# Patient Record
Sex: Male | Born: 1965 | Race: Asian | Hispanic: No | Marital: Single | Smoking: Current every day smoker
Health system: Southern US, Community
[De-identification: ages and names within clinical notes are randomized; demographics above are authoritative.]

## PROBLEM LIST (undated history)

## (undated) DIAGNOSIS — I2510 Atherosclerotic heart disease of native coronary artery without angina pectoris: Secondary | ICD-10-CM

## (undated) DIAGNOSIS — R0600 Dyspnea, unspecified: Secondary | ICD-10-CM

## (undated) DIAGNOSIS — I10 Essential (primary) hypertension: Secondary | ICD-10-CM

## (undated) DIAGNOSIS — E782 Mixed hyperlipidemia: Secondary | ICD-10-CM

## (undated) DIAGNOSIS — I77819 Aortic ectasia, unspecified site: Secondary | ICD-10-CM

## (undated) DIAGNOSIS — E785 Hyperlipidemia, unspecified: Secondary | ICD-10-CM

## (undated) DIAGNOSIS — E119 Type 2 diabetes mellitus without complications: Secondary | ICD-10-CM

## (undated) DIAGNOSIS — R0609 Other forms of dyspnea: Secondary | ICD-10-CM

## (undated) DIAGNOSIS — Z720 Tobacco use: Secondary | ICD-10-CM

## (undated) DIAGNOSIS — R931 Abnormal findings on diagnostic imaging of heart and coronary circulation: Secondary | ICD-10-CM

## (undated) DIAGNOSIS — I517 Cardiomegaly: Secondary | ICD-10-CM

## (undated) HISTORY — DX: Type 2 diabetes mellitus without complications: E11.9

## (undated) HISTORY — DX: Hyperlipidemia, unspecified: E78.5

## (undated) HISTORY — DX: Dyspnea, unspecified: R06.00

## (undated) HISTORY — DX: Essential (primary) hypertension: I10

## (undated) HISTORY — DX: Abnormal findings on diagnostic imaging of heart and coronary circulation: R93.1

## (undated) HISTORY — DX: Other forms of dyspnea: R06.09

## (undated) HISTORY — DX: Aortic ectasia, unspecified site: I77.819

## (undated) HISTORY — DX: Tobacco use: Z72.0

## (undated) HISTORY — DX: Cardiomegaly: I51.7

## (undated) HISTORY — DX: Atherosclerotic heart disease of native coronary artery without angina pectoris: I25.10

---
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
## 2019-03-07 ENCOUNTER — Ambulatory Visit: Payer: Managed Care, Other (non HMO) | Attending: Adult Health Nurse Practitioner

## 2019-03-07 DIAGNOSIS — Z20822 Contact with and (suspected) exposure to covid-19: Secondary | ICD-10-CM

## 2019-03-08 LAB — NOVEL CORONAVIRUS, NAA: SARS-CoV-2, NAA: NOT DETECTED

## 2019-06-19 ENCOUNTER — Encounter: Payer: Self-pay | Admitting: Cardiology

## 2019-06-19 ENCOUNTER — Other Ambulatory Visit: Payer: Self-pay

## 2019-06-19 ENCOUNTER — Ambulatory Visit: Payer: Managed Care, Other (non HMO) | Admitting: Cardiology

## 2019-06-19 VITALS — BP 142/84 | HR 85 | Ht 68.5 in | Wt 268.2 lb

## 2019-06-19 DIAGNOSIS — R0602 Shortness of breath: Secondary | ICD-10-CM | POA: Diagnosis not present

## 2019-06-19 DIAGNOSIS — E785 Hyperlipidemia, unspecified: Secondary | ICD-10-CM | POA: Diagnosis not present

## 2019-06-19 DIAGNOSIS — R0609 Other forms of dyspnea: Secondary | ICD-10-CM

## 2019-06-19 DIAGNOSIS — R0600 Dyspnea, unspecified: Secondary | ICD-10-CM | POA: Diagnosis not present

## 2019-06-19 DIAGNOSIS — I10 Essential (primary) hypertension: Secondary | ICD-10-CM | POA: Diagnosis not present

## 2019-06-19 MED ORDER — CARVEDILOL 3.125 MG PO TABS
3.1250 mg | ORAL_TABLET | Freq: Two times a day (BID) | ORAL | 1 refills | Status: DC
Start: 2019-06-19 — End: 2019-07-17

## 2019-06-19 MED ORDER — METOPROLOL TARTRATE 50 MG PO TABS: 50.0000 mg | ORAL_TABLET | Freq: Once | ORAL | 0 refills | Status: DC

## 2019-06-19 NOTE — Progress Notes (Signed)
Cardiology Office Note:    Date:  06/19/2019   ID:  Kevin Munoz, DOB January 10, 1966, MRN [id number]  PCP:  London Pepper, MD  Cardiologist:  No primary care provider on file.  Electrophysiologist:  None   Referring MD: London Pepper, MD   Reason for visit: DOE  History of Present Illness:    Kevin Munoz is a 54 y.o. male, very pleasant gentleman originally from [place] with a hx of diabetes mellitus on oral medications (most recent hemoglobin A1c 6.4%), obesity, hypertension, hyperlipidemia, who is coming with new complaint of dyspnea on exertion.  The patient does not exercise on a regular basis but walks a lot at work and has been under a lot of stress lately and has had 2 episodes of acute dyspnea on exertion associated with chest tightness.  He denies any dizziness palpitation or syncope.  No lower extremity edema.  His father had myocardial infarction at age of 53.   Past Medical History:  Diagnosis Date  . Combined hyperlipidemia   . DM (diabetes mellitus) ([place])   . Hyperlipidemia   . Hypertension   . Tobacco abuse       Current Medications: Current Meds  Medication Sig  . amLODipine (NORVASC) 10 MG tablet Take 10 mg by mouth daily.  Marland Kitchen glipiZIDE (GLUCOTROL) 10 MG tablet Take 10 mg by mouth daily before breakfast.  . JARDIANCE 10 MG TABS tablet Take 10 mg by mouth daily.  Marland Kitchen losartan (COZAAR) 100 MG tablet Take 100 mg by mouth daily.  Marland Kitchen lovastatin (MEVACOR) 40 MG tablet Take 40 mg by mouth at bedtime.  . metFORMIN (GLUCOPHAGE) 1000 MG tablet Take 1,000 mg by mouth 2 (two) times daily with a meal.  . Omega-3 Fatty Acids (FISH OIL) 1000 MG CAPS Take by mouth.     Allergies:   Patient has no known allergies.   Social History   Socioeconomic History  . Marital status: Single    Spouse name: Not on file  . Number of children: Not on file  . Years of education: Not on file  . Highest education level: Not on file  Occupational History  . Not on file  Tobacco Use  .  Smoking status: Current Every Day Smoker  . Smokeless tobacco: Never Used  Substance and Sexual Activity  . Alcohol use: Not on file  . Drug use: Not on file  . Sexual activity: Not on file  Other Topics Concern  . Not on file  Social History Narrative  . Not on file   Social Determinants of Health   Financial Resource Strain:   . Difficulty of Paying Living Expenses:   Food Insecurity:   . Worried About Charity fundraiser in the Last Year:   . Arboriculturist in the Last Year:   Transportation Needs:   . Film/video editor (Medical):   Marland Kitchen Lack of Transportation (Non-Medical):   Physical Activity:   . Days of Exercise per Week:   . Minutes of Exercise per Session:   Stress:   . Feeling of Stress :   Social Connections:   . Frequency of Communication with Friends and Family:   . Frequency of Social Gatherings with Friends and Family:   . Attends Religious Services:   . Active Member of Clubs or Organizations:   . Attends Archivist Meetings:   Marland Kitchen Marital Status:     Family History: As per HPI ROS:   Please see the history of present illness.  All other systems reviewed and are negative.  EKGs/Labs/Other Studies Reviewed:    The following studies were reviewed today:  EKG:  EKG is ordered today.  The ekg ordered today demonstrates sinus rhythm, left axis deviation, poor R wave progression in the anterior leads, no prior EKG available for comparison.  Personally reviewed.  Recent Labs: No results found for requested labs within last 8760 hours.  Recent Lipid Panel No results found for: CHOL, TRIG, HDL, CHOLHDL, VLDL, LDLCALC, LDLDIRECT  Physical Exam:    VS:  BP (!) 142/84   Pulse 85   Ht 5' 8.5" (1.74 m)   Wt 268 lb 3.2 oz (121.7 kg)   SpO2 96%   BMI 40.18 kg/m     Wt Readings from Last 3 Encounters:  06/19/19 268 lb 3.2 oz (121.7 kg)     GEN: Obese, well developed in no acute distress HEENT: Normal NECK: No JVD; No carotid bruits  LYMPHATICS: No lymphadenopathy CARDIAC: RRR, no murmurs, rubs, gallops RESPIRATORY:  Clear to auscultation without rales, wheezing or rhonchi  ABDOMEN: Soft, non-tender, non-distended MUSCULOSKELETAL:  No edema; No deformity  SKIN: Warm and dry NEUROLOGIC:  Alert and oriented x 3 PSYCHIATRIC:  Normal affect    ASSESSMENT:    No diagnosis found. PLAN:    In order of problems listed above:  1. Dyspnea on exertion, -with multiple risk factors including diabetes, hypertension, hyperlipidemia, obesity and physical inactivity, will obtain coronary CTA to further evaluate.  We will also obtain an echocardiogram. 2. Hypertension -I will add carvedilol 3.125 mg 3. Hyperlipidemia -on fish oil and lovastatin, will continue.   Medication Adjustments/Labs and Tests Ordered: Current medicines are reviewed at length with the patient today.  Concerns regarding medicines are outlined above.  No orders of the defined types were placed in this encounter.  No orders of the defined types were placed in this encounter.   There are no Patient Instructions on file for this visit.   Signed, Tobias Alexander, MD  06/19/2019 2:19 PM    [facility]

## 2019-06-19 NOTE — Patient Instructions (Signed)
Medication Instructions:   START TAKING CARVEDILOL 3.125 MG BY MOUTH TWICE DAILY WITH MEALS  *If you need a refill on your cardiac medications before your next appointment, please call your pharmacy*  Testing/Procedures:  Your physician has requested that you have an echocardiogram. Echocardiography is a painless test that uses sound waves to create images of your heart. It provides your doctor with information about the size and shape of your heart and how well your heart's chambers and valves are working. This procedure takes approximately one hour. There are no restrictions for this procedure.  Your cardiac CT will be scheduled at one of the below locations:   [facility] [address] [place], [place] [address] [phone]  If scheduled at [facility], please arrive at the [facility] main entrance of [facility] 30 minutes prior to test start time. Proceed to the [facility] Radiology Department (first floor) to check-in and test prep.  Please follow these instructions carefully (unless otherwise directed):  On the Night Before the Test: . Be sure to Drink plenty of water. . Do not consume any caffeinated/decaffeinated beverages or chocolate 12 hours prior to your test. . Do not take any antihistamines 12 hours prior to your test. . If you take Metformin, GLIPIZIDE, AND JARDIANCE do not take 24 hours prior to test.  On the Day of the Test: . Drink plenty of water. Do not drink any water within one hour of the test. . Do not eat any food 4 hours prior to the test. . You may take your regular medications prior to the test.  . Take metoprolol 50 MG (Lopressor) two hours prior to test     After the Test: . Drink plenty of water. . After receiving IV contrast, you may experience a mild flushed feeling. This is normal. . On occasion, you may experience a mild rash up to 24 hours after the test. This is not dangerous. If this occurs, you can take  Benadryl 25 mg and increase your fluid intake. . If you experience trouble breathing, this can be serious. If it is severe call 911 IMMEDIATELY. If it is mild, please call our office. . If you take any of these medications: Glipizide/Metformin, JARDIANCE, Avandament, Glucavance, please do not take 48 hours after completing test unless otherwise instructed  Once we have confirmed authorization from your insurance company, we will call you to set up a date and time for your test.   For non-scheduling related questions, please contact the cardiac imaging nurse navigator should you have any questions/concerns: Rockwell Alexandria, RN Navigator Cardiac Imaging [facility] [phone] office  For scheduling needs, including cancellations and rescheduling, please call [phone].    Follow-Up:  3 MONTHS WITH DR. Delton See IN THE OFFICE

## 2019-07-08 ENCOUNTER — Other Ambulatory Visit: Payer: Self-pay

## 2019-07-08 ENCOUNTER — Other Ambulatory Visit: Payer: Managed Care, Other (non HMO) | Admitting: *Deleted

## 2019-07-08 DIAGNOSIS — R0602 Shortness of breath: Secondary | ICD-10-CM

## 2019-07-08 DIAGNOSIS — E785 Hyperlipidemia, unspecified: Secondary | ICD-10-CM

## 2019-07-08 DIAGNOSIS — R0609 Other forms of dyspnea: Secondary | ICD-10-CM

## 2019-07-08 DIAGNOSIS — I10 Essential (primary) hypertension: Secondary | ICD-10-CM

## 2019-07-09 LAB — BASIC METABOLIC PANEL
BUN/Creatinine Ratio: 21 — ABNORMAL HIGH (ref 9–20)
BUN: 16 mg/dL (ref 6–24)
CO2: 22 mmol/L (ref 20–29)
Calcium: 9.5 mg/dL (ref 8.7–10.2)
Chloride: 102 mmol/L (ref 96–106)
Creatinine, Ser: 0.77 mg/dL (ref 0.76–1.27)
GFR calc Af Amer: 120 mL/min/{1.73_m2} (ref >59)
GFR calc non Af Amer: 104 mL/min/{1.73_m2} (ref >59)
Glucose: 82 mg/dL (ref 65–99)
Potassium: 4.9 mmol/L (ref 3.5–5.2)
Sodium: 139 mmol/L (ref 134–144)

## 2019-07-10 ENCOUNTER — Telehealth (HOSPITAL_COMMUNITY): Payer: Self-pay | Admitting: *Deleted

## 2019-07-10 NOTE — Telephone Encounter (Signed)
Reaching out to patient (with a [place]) to offer assistance regarding upcoming cardiac imaging study; pt verbalizes understanding of appt date/time, parking situation and where to check in, pre-test NPO status and medications ordered, and verified current allergies; name and call back number provided for further questions should they arise  [facility] [phone] office [phone] cell

## 2019-07-11 ENCOUNTER — Ambulatory Visit (HOSPITAL_COMMUNITY)
Admission: RE | Admit: 2019-07-11 | Discharge: 2019-07-11 | Disposition: A | Discharge status: Home / Self Care | Payer: Managed Care, Other (non HMO) | Source: Ambulatory Visit | Attending: Cardiology | Admitting: Cardiology

## 2019-07-11 ENCOUNTER — Other Ambulatory Visit: Payer: Self-pay

## 2019-07-11 ENCOUNTER — Ambulatory Visit (HOSPITAL_BASED_OUTPATIENT_CLINIC_OR_DEPARTMENT_OTHER): Payer: Managed Care, Other (non HMO)

## 2019-07-11 DIAGNOSIS — I10 Essential (primary) hypertension: Secondary | ICD-10-CM | POA: Insufficient documentation

## 2019-07-11 DIAGNOSIS — R0600 Dyspnea, unspecified: Secondary | ICD-10-CM | POA: Diagnosis present

## 2019-07-11 DIAGNOSIS — R0602 Shortness of breath: Secondary | ICD-10-CM

## 2019-07-11 DIAGNOSIS — E785 Hyperlipidemia, unspecified: Secondary | ICD-10-CM

## 2019-07-11 DIAGNOSIS — R0609 Other forms of dyspnea: Secondary | ICD-10-CM

## 2019-07-11 DIAGNOSIS — I2510 Atherosclerotic heart disease of native coronary artery without angina pectoris: Secondary | ICD-10-CM

## 2019-07-11 MED ORDER — NITROGLYCERIN 0.4 MG SL SUBL: 0.8000 mg | SUBLINGUAL_TABLET | Freq: Once | SUBLINGUAL | Status: AC

## 2019-07-11 MED ORDER — NITROGLYCERIN 0.4 MG SL SUBL
SUBLINGUAL_TABLET | SUBLINGUAL | Status: AC
  Administered 2019-07-11: 0.8 mg via SUBLINGUAL
  Filled 2019-07-11: qty 2

## 2019-07-11 MED ORDER — IOHEXOL 350 MG/ML SOLN
80.0000 mL | Freq: Once | INTRAVENOUS | Status: AC | PRN
  Administered 2019-07-11: 80 mL via INTRAVENOUS

## 2019-07-12 DIAGNOSIS — I2510 Atherosclerotic heart disease of native coronary artery without angina pectoris: Secondary | ICD-10-CM | POA: Diagnosis not present

## 2019-07-17 ENCOUNTER — Encounter: Payer: Self-pay | Admitting: Physician Assistant

## 2019-07-17 ENCOUNTER — Telehealth: Payer: Self-pay | Admitting: *Deleted

## 2019-07-17 MED ORDER — CARVEDILOL 6.25 MG PO TABS
6.2500 mg | ORAL_TABLET | Freq: Two times a day (BID) | ORAL | 1 refills | Status: DC
Start: 2019-07-17 — End: 2019-10-25

## 2019-07-17 NOTE — Telephone Encounter (Signed)
The interpreter services called me back and confirmed that they will be present at the pts OV with Dayna Dunn PA-C for 6/3 at 1:30 pm.

## 2019-07-17 NOTE — Progress Notes (Signed)
Cardiology Office Note    Date:  07/18/2019   ID:  Kevin Munoz, DOB 12-18-1965, MRN [id number]  PCP:  Farris Has, MD  Cardiologist:  Tobias Alexander, MD  Electrophysiologist:  None   Chief Complaint: discuss cath -> abnormal coronary CT  History of Present Illness:   Kevin Munoz is a 54 y.o. male from [place] with history of diabetes mellitus on oral medications (most recent hemoglobin A1c 6.4%), obesity, hypertension, hyperlipidemia, dilation of aorta by echo who presents for discussion about abnormal coronary CTA.  He recently saw Dr. Delton See 06/19/19 for new evaluation of acute dyspnea on exertion associated with chest tightness. His father had myocardial infarction at age of 55. 2D echo 07/11/19 showed EF 65-70%, moderate LVH, mild dilation of aorta. Dr. Delton See increased his carvedilol to 6.25mg  BID. He underwent coronary CTA as outlined below showing stenosis of the proximal LAD, felt to be severe by FFR. No significant extracardiac findings seen. Dr. Delton See has recommended cardiac cath and her team set him up to come in for visit today to discuss in detail. He is seen with [facility] interpreter. He reports he is doing well and denies any acute changes from last visit. In general he has noticed some DOE by comparison to when he is younger. He has not had any recent current episodes of chest tightness (this occurred twice). No hx TIA/stroke.  Last labs reviewed 07/08/19 with K 4.9, Cr 0.77, LDL by [facility] 12/2018 was 94, otherwise no recent labs on file in Epic.   Past Medical History:  Diagnosis Date  . CAD in native artery    a. by cor CT 06/2019.  . Dilatation of aorta ([facility])    a. by echo 06/2019.  . DM (diabetes mellitus) ([facility])   . Hyperlipidemia   . Hypertension   . Left ventricular hypertrophy   . Tobacco abuse     History reviewed. No pertinent surgical history.  Current Medications: Current Meds  Medication Sig  . amLODipine (NORVASC) 10 MG tablet Take 10 mg by mouth  daily.  Marland Kitchen aspirin EC 81 MG tablet Take 1 tablet (81 mg total) by mouth daily.  . carvedilol (COREG) 6.25 MG tablet Take 1 tablet (6.25 mg total) by mouth 2 (two) times daily.  Marland Kitchen glipiZIDE (GLUCOTROL) 10 MG tablet Take 10 mg by mouth daily before breakfast.  . JARDIANCE 10 MG TABS tablet Take 10 mg by mouth daily.  Marland Kitchen losartan (COZAAR) 100 MG tablet Take 100 mg by mouth daily.  Marland Kitchen lovastatin (MEVACOR) 40 MG tablet Take 40 mg by mouth at bedtime.  . metFORMIN (GLUCOPHAGE) 1000 MG tablet Take 1,000 mg by mouth 2 (two) times daily with a meal.  . nitroGLYCERIN (NITROSTAT) 0.4 MG SL tablet Place 1 tablet (0.4 mg total) under the tongue every 5 (five) minutes as needed.  . Omega-3 Fatty Acids (FISH OIL) 1000 MG CAPS Take by mouth.  . [DISCONTINUED] metoprolol tartrate (LOPRESSOR) 50 MG tablet Take 1 tablet (50 mg total) by mouth once for 1 dose. Take 2 hours prior to your Coronary CT.      Allergies:   Patient has no known allergies.   Social History   Socioeconomic History  . Marital status: Single    Spouse name: Not on file  . Number of children: Not on file  . Years of education: Not on file  . Highest education level: Not on file  Occupational History  . Not on file  Tobacco Use  . Smoking status:  Current Every Day Smoker  . Smokeless tobacco: Never Used  Substance and Sexual Activity  . Alcohol use: Never  . Drug use: Never  . Sexual activity: Not on file  Other Topics Concern  . Not on file  Social History Narrative  . Not on file   Social Determinants of Health   Financial Resource Strain:   . Difficulty of Paying Living Expenses:   Food Insecurity:   . Worried About Programme researcher, broadcasting/film/video in the Last Year:   . Barista in the Last Year:   Transportation Needs:   . Freight forwarder (Medical):   Marland Kitchen Lack of Transportation (Non-Medical):   Physical Activity:   . Days of Exercise per Week:   . Minutes of Exercise per Session:   Stress:   . Feeling of Stress :    Social Connections:   . Frequency of Communication with Friends and Family:   . Frequency of Social Gatherings with Friends and Family:   . Attends Religious Services:   . Active Member of Clubs or Organizations:   . Attends Banker Meetings:   Marland Kitchen Marital Status:      Family History:  The patient's family history includes CAD in his father; Heart attack in his father.  ROS:   Please see the history of present illness.   All other systems are reviewed and otherwise negative.    EKGs/Labs/Other Studies Reviewed:    Studies reviewed are outlined and summarized above. Reports included below if pertinent.  Cardiac Cath 06/2019 EXAM: CT FFR ANALYSIS  CLINICAL DATA:  54 year old male with chest pain and abnormal CCTA.  FINDINGS: FFRct analysis was performed on the original cardiac CT angiogram dataset. Diagrammatic representation of the FFRct analysis is provided in a separate PDF document in PACS. This dictation was created using the PDF document and an interactive 3D model of the results. 3D model is not available in the EMR/PACS. Normal FFR range is >0.80.  1. Left Main: 0.95.  2. LAD: Proximal: 0.79, mid: 0.63. 3. LCX: 0.92. 4. RCA: Proximal: 0.98, distal: 0.88.  IMPRESSION: 1. CT FFR analysis showed severe stenosis in the proximal LAD. A cardiac catheterization is recommended.   Electronically Signed   By: Tobias Alexander   On: 07/12/2019 08:48  ADDENDUM REPORT: 07/11/2019 16:54  CLINICAL DATA:  54 year old male with hyperlipidemia, hypertension and chest pain.  EXAM: Cardiac/Coronary  CTA  TECHNIQUE: The patient was scanned on a [employer].  FINDINGS: A 100 kV prospective scan was triggered in the descending thoracic aorta at 111 HU's. Axial non-contrast 3 mm slices were carried out through the heart. The data set was analyzed on a dedicated work station and scored using the Agatson method. Gantry rotation speed was  250 msecs and collimation was .6 mm. 100 mg of PO Metoprolol and 0.8 mg of sl NTG was given. The 3D data set was reconstructed in 5% intervals of the 67-82 % of the R-R cycle. Diastolic phases were analyzed on a dedicated work station using MPR, MIP and VRT modes. The patient received 80 cc of contrast.  Aorta:  Normal size.  Mild diffuse calcifications.  No dissection.  Aortic Valve:  Trileaflet.  No calcifications.  Coronary Arteries:  Normal coronary origin.  Right dominance.  RCA is a large dominant artery that gives rise to PDA and PLA. There is only minimal plaque.  Left main is a large artery that gives rise to LAD and LCX arteries. Left  main has no plaque.  LAD is a large vessel that gives rise to one small diagonal artery. There is mild mixed plaque in the proximal LAD with stenosis 25-49%. There is another non-calcified plaque in the proximal LAD with stenosis 50-69% but possibly > 70%. Mid and distal LAD has minimal calcified plaque.  LCX is a non-dominant artery that gives rise to two OM branches. There is minimal plaque.  Other findings:  Normal pulmonary vein drainage into the left atrium.  Normal left atrial appendage without a thrombus.  Normal size of the pulmonary artery.  IMPRESSION: 1. Coronary calcium score of 5. This was 6 percentile for age and sex matched control.  2. Normal coronary origin with right dominance.  3. CAD-RADS 3. At least moderate stenosis in the proximal LAD. Consider symptom-guided anti-ischemic pharmacotherapy as well as risk factor modification per guideline directed care. Additional analysis with CT FFR will be submitted.   Electronically Signed   By: Ena Dawley   On: 07/11/2019 16:54  EXAM: OVER-READ INTERPRETATION  CT CHEST  The following report is an over-read performed by radiologist Dr. Vinnie Langton of [facility], [place] on 07/11/2019. This over-read does not include interpretation  of cardiac or coronary anatomy or pathology. The coronary calcium score/coronary CTA interpretation by the cardiologist is attached.  COMPARISON:  None.  FINDINGS: Within the visualized portions of the thorax there are no suspicious appearing pulmonary nodules or masses, there is no acute consolidative airspace disease, no pleural effusions, no pneumothorax and no lymphadenopathy. Visualized portions of the upper abdomen are unremarkable. There are no aggressive appearing lytic or blastic lesions noted in the visualized portions of the skeleton.  IMPRESSION: 1. No significant incidental noncardiac findings are noted.  Electronically Signed: By: Vinnie Langton M.D. On: 07/11/2019 15:16     EKG:  EKG is ordered today, personally reviewed, demonstrating NSR 69bpm, inferior Q waves in lead III, avF, TWI III, poor R wave progression in precordial leads  Recent Labs: 07/08/2019: BUN 16; Creatinine, Ser 0.77; Potassium 4.9; Sodium 139  Recent Lipid Panel No results found for: CHOL, TRIG, HDL, CHOLHDL, VLDL, LDLCALC, LDLDIRECT  PHYSICAL EXAM:    VS:  BP 114/78   Pulse 69   Ht 5' 8.5" (1.74 m)   Wt 269 lb 9.6 oz (122.3 kg)   SpO2 96%   BMI 40.40 kg/m   BMI: Body mass index is 40.4 kg/m.  GEN: Well nourished, well developed [place] M, in no acute distress HEENT: normocephalic, atraumatic Neck: no JVD, carotid bruits, or masses Cardiac: RRR; no murmurs, rubs, or gallops, no edema  Respiratory:  clear to auscultation bilaterally, normal work of breathing GI: soft, nontender, nondistended, + BS MS: no deformity or atrophy Skin: warm and dry, no rash Neuro:  Alert and Oriented x 3, Strength and sensation are intact, follows commands Psych: euthymic mood, full affect  Wt Readings from Last 3 Encounters:  07/18/19 269 lb 9.6 oz (122.3 kg)  06/19/19 268 lb 3.2 oz (121.7 kg)     ASSESSMENT & PLAN:   1. Dyspnea on exertion with CAD by CT - based on appearance of  coronary CTA, Dr. Meda Coffee has recommended cardiac cath. Risks and benefits of cardiac catheterization have been discussed with the patient.  These include bleeding, infection, kidney damage, stroke, heart attack, death.  The patient understands these risks and is willing to proceed. Denies any allergies. Start ASA 81mg  and rx SL NTG PRN. Continue beta blocker. Will update CBC and TSH with  labs today. Will also get CMET - if cardiac cath confirms definitive CAD, would suggest switching lovastatin before discharge to higher intensity statin with f/u labs in 6-8 weeks. He also inquired about cost of procedure. RMA called over to billing and they were able to furnish an out of pocket estimate of $910 for the procedural cost but this does not include the doctor's charges, therefore he was made aware this is likely not the full estimate. 2. Essential HTN - BP well controlled with recent titration of carvedilol. Continue present regimen. 3. Hyperlipidemia - see above regarding lipids. LDL was 94 by [facility] in 12/2018. He's not fasting today so we did not check in visit today, and regardless as above, may require statin titration anyway. Await cath. 4. Dilation of aorta - noted by echocardiogram but CT showed normal aortic size. Consider repeating echo in 1 year to trend.  Disposition: F/u with Dr. Beaulah Corin team in 2 weeks. He also inquired whether we could potentially text him information/instructions due to the language barrier. We do not specifically have HIPAA compliant texting ability, so provided information on signing up for MyChart.  Medication Adjustments/Labs and Tests Ordered: Current medicines are reviewed at length with the patient today.  Concerns regarding medicines are outlined above. Medication changes, Labs and Tests ordered today are summarized above and listed in the Patient Instructions accessible in Encounters.   Signed, Laurann Montana, PA-C  07/18/2019 2:26 PM    [facility]  [facility] [address] [place], [place], [place]  [address] Phone: [phone]; Fax: [phone]

## 2019-07-17 NOTE — Telephone Encounter (Signed)
Called the pt via [facility] and Japanese Translator Kevin Munoz was able to communicate to the pt about abnormal Coronary CT results and abnormal Echo results.   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  Pt wanted all communication to go through her about his results and plan per Dr. Nelson, instead of me endorsing his test results to him through the interpreter. Interpreter did remain on the line as needed.  Endorsed to Kevin (speaks English) that the pt will need to come into the office preferably tomorrow, to discuss his abnormal echo and Coronary CT results, and to arrange for LHC at this visit.  Informed Kevin that we will also need to obtain pre-procedure labs on him, as well as another EKG, per cath protocol.  Informed Kevin that we will arrange this cath at this visit, and go over in great detail, his cardiac cath instructions.  Both parties agreed that he does need a face-to-face encounter with the Provider, to go over his results and plan to have a cath.  Per Kevin, he will need to have a Japanese interpreter present, for unfortunately she will not be able to attend this appt, being its on such short notice.   Scheduled the pt to come in and see Kevin Dunn PA-C for tomorrow 6/3 at 1:45 pm.  Kevin is aware to endorse to him that he needs to arrive 15 mins prior to this appt, and bring his med list with him. Informed Kevin that I will have an interpreter present at this visit, to assist the pt.  Also endorsed to Kevin that she can tell him that Dr. Nelson also wants to increase his Carvedilol to 6.25 mg po bid, due to echo results (went over with them on the phone).  Informed Kevin that I will send in increased dose to his confirmed pharmacy of choice.  Kevin states she handles his medications as well as her Husband  does as well, so they will go and pick this up for the pt, and will explain to him the dose increase.  Informed Kevin that she needs to communicate to the pt that he needs to sign a DPR form in our office tomorrow, so that we can get her, and her Husbands contact information in writing, incase the pt is not around to give us permission to speak with her, and it  will therefore be in writing.  Kevin verbalized understanding and agrees with this plan.  She is communicating to the pt that he needs to ask for this form tomorrow and sign this accordingly, with the assistance of the Japanese Interpreter.   Called [facility], and informed them that we will need an experienced Japanese interpreter present at the pts visit tomorrow with Kevin Dunn PA-C at 1:45 pm, be here at 1:30 pm. Also informed the Interpreter line that the Interpreter who assist the pt tomorrow, will need to also help assist him with filling out his DPR form, to reflect that all medical information may be released to EC Kevin Munoz and her Spouse, incase pt is not around her to give us permission to talk to them like today.    OFF NOTE: Pt was seen on 06/19/19 by Dr. Nelson and had an EKG and BMET were done at that time.  This will more   than likely be over the 30 day window for scheduling cath, per protocol, so he will need to have an EKG, pre-cath labs done, and updated H&P done, at his visit with Kevin Spies PA-C tomorrow 6/3/21at 1:45 pm. Updated this in appt notes, as well as interpreter services contacted to be present at this visit.

## 2019-07-17 NOTE — Telephone Encounter (Signed)
Called the pt via [place] and [place] was able to communicate to the pt about abnormal Coronary CT results and abnormal Echo results.   Pt wanted all his affairs to be handled by [place] his EC and helps the pt out with MD appts, medicines, etc, for he has no family in this country and the pt works for her [employer].  Pt wanted all communication to go through her about his results and plan per Dr. Meda Coffee, instead of me endorsing his test results to him through the interpreter. Interpreter did remain on the line as needed.  Endorsed to Sharyn Lull (speaks English) that the pt will need to come into the office preferably tomorrow, to discuss his abnormal echo and Coronary CT results, and to arrange for LHC at this visit.  Informed Sharyn Lull that we will also need to obtain pre-procedure labs on him, as well as another EKG, per cath protocol.  Informed Sharyn Lull that we will arrange this cath at this visit, and go over in great detail, his cardiac cath instructions.  Both parties agreed that he does need a face-to-face encounter with the Provider, to go over his results and plan to have a cath.  Per Sharyn Lull, he will need to have a [place] interpreter present, for unfortunately she will not be able to attend this appt, being its on such short notice.   Scheduled the pt to come in and see Melina Copa PA-C for tomorrow 6/3 at 1:45 pm.  Sharyn Lull is aware to endorse to him that he needs to arrive 15 mins prior to this appt, and bring his med list with him. Informed Sharyn Lull that I will have an interpreter present at this visit, to assist the pt.  Also endorsed to Sharyn Lull that she can tell him that Dr. Meda Coffee also wants to increase his Carvedilol to 6.25 mg po bid, due to echo results (went over with them on the phone).  Informed Sharyn Lull that I will send in increased dose to his confirmed pharmacy of choice.  Sharyn Lull states she handles his medications as well as her Husband  does as well, so they will go and pick this up for the pt, and will explain to him the dose increase.  Informed Sharyn Lull that she needs to communicate to the pt that he needs to sign a DPR form in our office tomorrow, so that we can get her, and her Husbands contact information in writing, incase the pt is not around to give [place] permission to speak with her, and it  will therefore be in writing.  Sharyn Lull verbalized understanding and agrees with this plan.  She is communicating to the pt that he needs to ask for this form tomorrow and sign this accordingly, with the assistance of the [place].   Called [employer], and informed them that we will need an experienced [place] interpreter present at the pts visit tomorrow with Melina Copa PA-C at 1:45 pm, be here at 1:30 pm. Also informed the Interpreter line that the Interpreter who assist the pt tomorrow, will need to also help assist him with filling out his DPR form, to reflect that all medical information may be released to [place] and her Spouse, incase pt is not around her to give [place] permission to talk to them like today.    OFF NOTE: Pt was seen on 06/19/19 by Dr. Meda Coffee and had an EKG and BMET were done at that time.  This will more  than likely be over the 30 day window for scheduling cath, per protocol, so he will need to have an EKG, pre-cath labs done, and updated H&P done, at his visit with Ronie Spies PA-C tomorrow 6/3/21at 1:45 pm. Updated this in appt notes, as well as interpreter services contacted to be present at this visit.

## 2019-07-17 NOTE — Telephone Encounter (Signed)
-----   Message from Lars Masson, MD sent at 07/12/2019  8:54 AM EDT ----- He has severe stenosis in the proximal LAD and needs a cath, could you arrange? You will need an interpreter to talk to him. Thank you

## 2019-07-17 NOTE — H&P (View-Only) (Signed)
 Cardiology Office Note    Date:  07/18/2019   ID:  Kevin Munoz, DOB 04/02/1965, MRN [id number]  PCP:  Morrow, Aaron, MD  Cardiologist:  Katarina Nelson, MD  Electrophysiologist:  None   Chief Complaint: discuss cath -> abnormal coronary CT  History of Present Illness:   Kevin Munoz is a 54 y.o. male from [place] with history of diabetes mellitus on oral medications (most recent hemoglobin A1c 6.4%), obesity, hypertension, hyperlipidemia, dilation of aorta by echo who presents for discussion about abnormal coronary CTA.  He recently saw Dr. Nelson 06/19/19 for new evaluation of acute dyspnea on exertion associated with chest tightness. His father had myocardial infarction at age of 70. 2D echo 07/11/19 showed EF 65-70%, moderate LVH, mild dilation of aorta. Dr. Nelson increased his carvedilol to 6.25mg BID. He underwent coronary CTA as outlined below showing stenosis of the proximal LAD, felt to be severe by FFR. No significant extracardiac findings seen. Dr. Nelson has recommended cardiac cath and her team set him up to come in for visit today to discuss in detail. He is seen with Kevin Munoz interpreter. He reports he is doing well and denies any acute changes from last visit. In general he has noticed some DOE by comparison to when he is younger. He has not had any recent current episodes of chest tightness (this occurred twice). No hx TIA/stroke.  Last labs reviewed 07/08/19 with K 4.9, Cr 0.77, LDL by [facility] 12/2018 was 94, otherwise no recent labs on file in Epic.   Past Medical History:  Diagnosis Date  . CAD in native artery    a. by cor CT 06/2019.  . Dilatation of aorta (HCC)    a. by echo 06/2019.  . DM (diabetes mellitus) (HCC)   . Hyperlipidemia   . Hypertension   . Left ventricular hypertrophy   . Tobacco abuse     History reviewed. No pertinent surgical history.  Current Medications: Current Meds  Medication Sig  . amLODipine (NORVASC) 10 MG tablet Take 10 mg by mouth  daily.  . aspirin EC 81 MG tablet Take 1 tablet (81 mg total) by mouth daily.  . carvedilol (COREG) 6.25 MG tablet Take 1 tablet (6.25 mg total) by mouth 2 (two) times daily.  . glipiZIDE (GLUCOTROL) 10 MG tablet Take 10 mg by mouth daily before breakfast.  . JARDIANCE 10 MG TABS tablet Take 10 mg by mouth daily.  . losartan (COZAAR) 100 MG tablet Take 100 mg by mouth daily.  . lovastatin (MEVACOR) 40 MG tablet Take 40 mg by mouth at bedtime.  . metFORMIN (GLUCOPHAGE) 1000 MG tablet Take 1,000 mg by mouth 2 (two) times daily with a meal.  . nitroGLYCERIN (NITROSTAT) 0.4 MG SL tablet Place 1 tablet (0.4 mg total) under the tongue every 5 (five) minutes as needed.  . Omega-3 Fatty Acids (FISH OIL) 1000 MG CAPS Take by mouth.  . [DISCONTINUED] metoprolol tartrate (LOPRESSOR) 50 MG tablet Take 1 tablet (50 mg total) by mouth once for 1 dose. Take 2 hours prior to your Coronary CT.      Allergies:   Patient has no known allergies.   Social History   Socioeconomic History  . Marital status: Single    Spouse name: Not on file  . Number of children: Not on file  . Years of education: Not on file  . Highest education level: Not on file  Occupational History  . Not on file  Tobacco Use  . Smoking status:   Current Every Day Smoker  . Smokeless tobacco: Never Used  Substance and Sexual Activity  . Alcohol use: Never  . Drug use: Never  . Sexual activity: Not on file  Other Topics Concern  . Not on file  Social History Narrative  . Not on file   Social Determinants of Health   Financial Resource Strain:   . Difficulty of Paying Living Expenses:   Food Insecurity:   . Worried About Programme researcher, broadcasting/film/video in the Last Year:   . Barista in the Last Year:   Transportation Needs:   . Freight forwarder (Medical):   Marland Kitchen Lack of Transportation (Non-Medical):   Physical Activity:   . Days of Exercise per Week:   . Minutes of Exercise per Session:   Stress:   . Feeling of Stress :    Social Connections:   . Frequency of Communication with Friends and Family:   . Frequency of Social Gatherings with Friends and Family:   . Attends Religious Services:   . Active Member of Clubs or Organizations:   . Attends Banker Meetings:   Marland Kitchen Marital Status:      Family History:  The patient's family history includes CAD in his father; Heart attack in his father.  ROS:   Please see the history of present illness.   All other systems are reviewed and otherwise negative.    EKGs/Labs/Other Studies Reviewed:    Studies reviewed are outlined and summarized above. Reports included below if pertinent.  Cardiac Cath 06/2019 EXAM: CT FFR ANALYSIS  CLINICAL DATA:  54 year old male with chest pain and abnormal CCTA.  FINDINGS: FFRct analysis was performed on the original cardiac CT angiogram dataset. Diagrammatic representation of the FFRct analysis is provided in a separate PDF document in PACS. This dictation was created using the PDF document and an interactive 3D model of the results. 3D model is not available in the EMR/PACS. Normal FFR range is >0.80.  1. Left Main: 0.95.  2. LAD: Proximal: 0.79, mid: 0.63. 3. LCX: 0.92. 4. RCA: Proximal: 0.98, distal: 0.88.  IMPRESSION: 1. CT FFR analysis showed severe stenosis in the proximal LAD. A cardiac catheterization is recommended.   Electronically Signed   By: Kevin Munoz   On: 07/12/2019 08:48  ADDENDUM REPORT: 07/11/2019 16:54  CLINICAL DATA:  54 year old male with hyperlipidemia, hypertension and chest pain.  EXAM: Cardiac/Coronary  CTA  TECHNIQUE: The patient was scanned on a [employer].  FINDINGS: A 100 kV prospective scan was triggered in the descending thoracic aorta at 111 HU's. Axial non-contrast 3 mm slices were carried out through the heart. The data set was analyzed on a dedicated work station and scored using the Agatson method. Gantry rotation speed was  250 msecs and collimation was .6 mm. 100 mg of PO Metoprolol and 0.8 mg of sl NTG was given. The 3D data set was reconstructed in 5% intervals of the 67-82 % of the R-R cycle. Diastolic phases were analyzed on a dedicated work station using MPR, MIP and VRT modes. The patient received 80 cc of contrast.  Aorta:  Normal size.  Mild diffuse calcifications.  No dissection.  Aortic Valve:  Trileaflet.  No calcifications.  Coronary Arteries:  Normal coronary origin.  Right dominance.  RCA is a large dominant artery that gives rise to PDA and PLA. There is only minimal plaque.  Left main is a large artery that gives rise to LAD and LCX arteries. Left  main has no plaque.  LAD is a large vessel that gives rise to one small diagonal artery. There is mild mixed plaque in the proximal LAD with stenosis 25-49%. There is another non-calcified plaque in the proximal LAD with stenosis 50-69% but possibly > 70%. Mid and distal LAD has minimal calcified plaque.  LCX is a non-dominant artery that gives rise to two OM branches. There is minimal plaque.  Other findings:  Normal pulmonary vein drainage into the left atrium.  Normal left atrial appendage without a thrombus.  Normal size of the pulmonary artery.  IMPRESSION: 1. Coronary calcium score of 5. This was 6 percentile for age and sex matched control.  2. Normal coronary origin with right dominance.  3. CAD-RADS 3. At least moderate stenosis in the proximal LAD. Consider symptom-guided anti-ischemic pharmacotherapy as well as risk factor modification per guideline directed care. Additional analysis with CT FFR will be submitted.   Electronically Signed   By: Ena Dawley   On: 07/11/2019 16:54  EXAM: OVER-READ INTERPRETATION  CT CHEST  The following report is an over-read performed by radiologist Dr. Vinnie Langton of [facility], [place] on 07/11/2019. This over-read does not include interpretation  of cardiac or coronary anatomy or pathology. The coronary calcium score/coronary CTA interpretation by the cardiologist is attached.  COMPARISON:  None.  FINDINGS: Within the visualized portions of the thorax there are no suspicious appearing pulmonary nodules or masses, there is no acute consolidative airspace disease, no pleural effusions, no pneumothorax and no lymphadenopathy. Visualized portions of the upper abdomen are unremarkable. There are no aggressive appearing lytic or blastic lesions noted in the visualized portions of the skeleton.  IMPRESSION: 1. No significant incidental noncardiac findings are noted.  Electronically Signed: By: Vinnie Langton Munoz.D. On: 07/11/2019 15:16     EKG:  EKG is ordered today, personally reviewed, demonstrating NSR 69bpm, inferior Q waves in lead III, avF, TWI III, poor R wave progression in precordial leads  Recent Labs: 07/08/2019: BUN 16; Creatinine, Ser 0.77; Potassium 4.9; Sodium 139  Recent Lipid Panel No results found for: CHOL, TRIG, HDL, CHOLHDL, VLDL, LDLCALC, LDLDIRECT  PHYSICAL EXAM:    VS:  BP 114/78   Pulse 69   Ht 5' 8.5" (1.74 Munoz)   Wt 269 lb 9.6 oz (122.3 kg)   SpO2 96%   BMI 40.40 kg/Munoz   BMI: Body mass index is 40.4 kg/Munoz.  GEN: Well nourished, well developed Kevin Munoz, in no acute distress HEENT: normocephalic, atraumatic Neck: no JVD, carotid bruits, or masses Cardiac: RRR; no murmurs, rubs, or gallops, no edema  Respiratory:  clear to auscultation bilaterally, normal work of breathing GI: soft, nontender, nondistended, + BS MS: no deformity or atrophy Skin: warm and dry, no rash Neuro:  Alert and Oriented x 3, Strength and sensation are intact, follows commands Psych: euthymic mood, full affect  Wt Readings from Last 3 Encounters:  07/18/19 269 lb 9.6 oz (122.3 kg)  06/19/19 268 lb 3.2 oz (121.7 kg)     ASSESSMENT & PLAN:   1. Dyspnea on exertion with CAD by CT - based on appearance of  coronary CTA, Dr. Meda Coffee has recommended cardiac cath. Risks and benefits of cardiac catheterization have been discussed with the patient.  These include bleeding, infection, kidney damage, stroke, heart attack, death.  The patient understands these risks and is willing to proceed. Denies any allergies. Start ASA 81mg  and rx SL NTG PRN. Continue beta blocker. Will update CBC and TSH with  labs today. Will also get CMET - if cardiac cath confirms definitive CAD, would suggest switching lovastatin before discharge to higher intensity statin with f/u labs in 6-8 weeks. He also inquired about cost of procedure. RMA called over to billing and they were able to furnish an out of pocket estimate of $910 for the procedural cost but this does not include the doctor's charges, therefore he was made aware this is likely not the full estimate. 2. Essential HTN - BP well controlled with recent titration of carvedilol. Continue present regimen. 3. Hyperlipidemia - see above regarding lipids. LDL was 94 by [facility] in 12/2018. He's not fasting today so we did not check in visit today, and regardless as above, may require statin titration anyway. Await cath. 4. Dilation of aorta - noted by echocardiogram but CT showed normal aortic size. Consider repeating echo in 1 year to trend.  Disposition: F/u with Dr. Beaulah Corin team in 2 weeks. He also inquired whether we could potentially text him information/instructions due to the language barrier. We do not specifically have HIPAA compliant texting ability, so provided information on signing up for MyChart.  Medication Adjustments/Labs and Tests Ordered: Current medicines are reviewed at length with the patient today.  Concerns regarding medicines are outlined above. Medication changes, Labs and Tests ordered today are summarized above and listed in the Patient Instructions accessible in Encounters.   Signed, Laurann Montana, PA-C  07/18/2019 2:26 PM    [facility]  HeartCare [address] [place], [place], [place]  [address] Phone: [phone]; Fax: [phone]

## 2019-07-17 NOTE — Telephone Encounter (Signed)
-----   Message from Lars Masson, MD sent at 07/12/2019 10:36 AM EDT ----- Normal LVEF, moderate concentric hypertrophy, I would increase carvedilol to 6.25 mg PO BID  He also needs a left heart catheterization, please arrange.

## 2019-07-17 NOTE — Telephone Encounter (Signed)
The interpreter services called me back and confirmed that they will be present at the pts OV with Dayna Dunn PA-C for 6/3 at 1:30 pm.  

## 2019-07-18 ENCOUNTER — Encounter: Payer: Self-pay | Admitting: Physician Assistant

## 2019-07-18 ENCOUNTER — Other Ambulatory Visit: Payer: Self-pay

## 2019-07-18 ENCOUNTER — Ambulatory Visit: Payer: Managed Care, Other (non HMO) | Admitting: Physician Assistant

## 2019-07-18 VITALS — BP 114/78 | HR 69 | Ht 68.5 in | Wt 269.6 lb

## 2019-07-18 DIAGNOSIS — R0609 Other forms of dyspnea: Secondary | ICD-10-CM

## 2019-07-18 DIAGNOSIS — R0600 Dyspnea, unspecified: Secondary | ICD-10-CM

## 2019-07-18 DIAGNOSIS — I2510 Atherosclerotic heart disease of native coronary artery without angina pectoris: Secondary | ICD-10-CM

## 2019-07-18 DIAGNOSIS — E785 Hyperlipidemia, unspecified: Secondary | ICD-10-CM

## 2019-07-18 DIAGNOSIS — I10 Essential (primary) hypertension: Secondary | ICD-10-CM | POA: Diagnosis not present

## 2019-07-18 DIAGNOSIS — I77819 Aortic ectasia, unspecified site: Secondary | ICD-10-CM

## 2019-07-18 MED ORDER — ASPIRIN EC 81 MG PO TBEC
81.0000 mg | DELAYED_RELEASE_TABLET | Freq: Every day | ORAL | 3 refills | Status: DC
Start: 2019-07-18 — End: 2020-11-13

## 2019-07-18 MED ORDER — SODIUM CHLORIDE 0.9% FLUSH: 3.0000 mL | Freq: Two times a day (BID) | INTRAVENOUS | Status: AC

## 2019-07-18 MED ORDER — NITROGLYCERIN 0.4 MG SL SUBL: 0.4000 mg | SUBLINGUAL_TABLET | SUBLINGUAL | 3 refills | Status: AC | PRN

## 2019-07-18 NOTE — Patient Instructions (Addendum)
Medication Instructions:  Your physician has recommended you make the following change in your medication:  1.  START Aspirin 81 mg taking 1 daily 2.  START Nitroglycerin 0.4 s/l tablets.  USE ONLY AS NEEDED FOR CHEST PAIN.Marland Kitchen USE AS DIRECTED ON THE BOTTLE  *If you need a refill on your cardiac medications before your next appointment, please call your pharmacy*   Lab Work: TODAY:  CMET, CBC, & TSH  If you have labs (blood work) drawn today and your tests are completely normal, you will receive your results only by: Marland Kitchen MyChart Message (if you have MyChart) OR . A paper copy in the mail If you have any lab test that is abnormal or we need to change your treatment, we will call you to review the results.   Testing/Procedures: Your physician has requested that you have a cardiac catheterization. Cardiac catheterization is used to diagnose and/or treat various heart conditions. Doctors may recommend this procedure for a number of different reasons. The most common reason is to evaluate chest pain. Chest pain can be a symptom of coronary artery disease (CAD), and cardiac catheterization can show whether plaque is narrowing or blocking your heart's arteries. This procedure is also used to evaluate the valves, as well as measure the blood flow and oxygen levels in different parts of your heart. For further information please visit [email or web address] Please follow instruction sheet, BELOW:      [facility] ST OFFICE [address] [place], [address] [place] [address] Dept: [phone] Loc: [phone]  Cohl Behrens  07/18/2019  You are scheduled for a Cardiac Catheterization on Wednesday, June 9 with Dr. [name].  1. Please arrive at the [facility] (Main Entrance A) at [facility]: [address] [place], [place] [address] at 9:30 AM (This time is two hours before your procedure to ensure your  preparation). Free valet parking service is available.   Special note: Every effort is made to have your procedure done on time. Please understand that emergencies sometimes delay scheduled procedures.  2. Diet: Do not eat solid foods after midnight.  The patient may have clear liquids until 5am upon the day of the procedure.  3. Labs: WILL BE DONE TODAY, BUT ON Monday, June 7 AT 12:40, YOU WILL NEED TO GO TO [facility], [address], [place] FOR COVID TESTING.  ONCE YOU ARE TESTED, YOU WILL BE REQUIRED TO GO STRAIGHT HOME AND QUARANTINE UNTIL YOUR PROCEDURE.  4. Medication instructions in preparation for your procedure:   Contrast Allergy: No   Do not take Diabetes Med Glucophage (Metformin) on the day of the procedure and HOLD 48 HOURS AFTER THE PROCEDURE.  Do not take Jardiance or Glipizide the morning of your procedure  On the morning of your procedure, take your Aspirin and any morning medicines NOT listed above.  You may use sips of water.  5. Plan for one night stay--bring personal belongings. 6. Bring a current list of your medications and current insurance cards. 7. You MUST have a responsible person to drive you home. 8. Someone MUST be with you the first 24 hours after you arrive home or your discharge will be delayed. 9. Please wear clothes that are easy to get on and off and wear slip-on shoes.  Thank you for allowing [place] to care for you!   -- [facility]     Follow-Up: At [facility], you  and your health needs are our priority.  As part of our continuing mission to provide you with exceptional heart care, we have created designated Provider Care Teams.  These Care Teams include your primary Cardiologist (physician) and Advanced Practice Providers (APPs -  Physician Assistants and Nurse Practitioners) who all work together to provide you with the care you need, when you need it.  We recommend signing up for  the patient portal called "MyChart".  Sign up information is provided on this After Visit Summary.  MyChart is used to connect with patients for Virtual Visits (Telemedicine).  Patients are able to view lab/test results, encounter notes, upcoming appointments, etc.  Non-urgent messages can be sent to your provider as well.   To learn more about what you can do with MyChart, go to [email or web address].    Your next appointment:   2 week(s)   08/09/19 ARRIVE AT 10:30 FOR REGISTRATION   The format for your next appointment:   In Person  Provider:   You may see Ena Dawley, MD or one of the following Advanced Practice Providers on your designated Care Team:    Melina Copa, PA-C  Ermalinda Barrios, PA-C    Other Instructions

## 2019-07-19 ENCOUNTER — Telehealth (HOSPITAL_COMMUNITY): Payer: Self-pay | Admitting: *Deleted

## 2019-07-19 LAB — CBC
Hematocrit: 46.8 % (ref 37.5–51.0)
Hemoglobin: 15.5 g/dL (ref 13.0–17.7)
MCH: 31.5 pg (ref 26.6–33.0)
MCHC: 33.1 g/dL (ref 31.5–35.7)
MCV: 95 fL (ref 79–97)
Platelets: 268 10*3/uL (ref 150–450)
RBC: 4.92 x10E6/uL (ref 4.14–5.80)
RDW: 13.2 % (ref 11.6–15.4)
WBC: 6.6 10*3/uL (ref 3.4–10.8)

## 2019-07-19 LAB — COMPREHENSIVE METABOLIC PANEL
ALT: 24 IU/L (ref 0–44)
AST: 17 IU/L (ref 0–40)
Albumin/Globulin Ratio: 2.6 — ABNORMAL HIGH (ref 1.2–2.2)
Albumin: 4.9 g/dL (ref 3.8–4.9)
Alkaline Phosphatase: 45 IU/L — ABNORMAL LOW (ref 48–121)
BUN/Creatinine Ratio: 17 (ref 9–20)
BUN: 13 mg/dL (ref 6–24)
Bilirubin Total: 0.5 mg/dL (ref 0.0–1.2)
CO2: 25 mmol/L (ref 20–29)
Calcium: 9.8 mg/dL (ref 8.7–10.2)
Chloride: 100 mmol/L (ref 96–106)
Creatinine, Ser: 0.75 mg/dL — ABNORMAL LOW (ref 0.76–1.27)
GFR calc Af Amer: 121 mL/min/{1.73_m2} (ref >59)
GFR calc non Af Amer: 105 mL/min/{1.73_m2} (ref >59)
Globulin, Total: 1.9 g/dL (ref 1.5–4.5)
Glucose: 90 mg/dL (ref 65–99)
Potassium: 4.6 mmol/L (ref 3.5–5.2)
Sodium: 138 mmol/L (ref 134–144)
Total Protein: 6.8 g/dL (ref 6.0–8.5)

## 2019-07-19 LAB — TSH: TSH: 1.38 u[IU]/mL (ref 0.450–4.500)

## 2019-07-19 NOTE — Telephone Encounter (Signed)
error 

## 2019-07-22 ENCOUNTER — Other Ambulatory Visit (HOSPITAL_COMMUNITY): Payer: Managed Care, Other (non HMO)

## 2019-07-22 ENCOUNTER — Telehealth: Payer: Self-pay | Admitting: *Deleted

## 2019-07-22 ENCOUNTER — Ambulatory Visit: Payer: Managed Care, Other (non HMO) | Attending: Internal Medicine

## 2019-07-22 DIAGNOSIS — Z20822 Contact with and (suspected) exposure to covid-19: Secondary | ICD-10-CM

## 2019-07-22 NOTE — Telephone Encounter (Addendum)
Pt contacted pre-catheterization scheduled at [facility] for: Wednesday July 24, 2019 11:30 AM Verified arrival time and place: [facility] Main Entrance [facility]) at: 9:30 AM   No solid food after midnight prior to cath, clear liquids until 5 AM day of procedure.  Hold: Metformin-day of procedure and 48 hours post procedure. Glipizide-AM of procedure. Jardiance-AM of procedure.  Except hold medications AM meds can be  taken pre-cath with sip of water including: ASA 81 mg   Confirmed patient has responsible adult to drive home post procedure and observe 24 hours after arriving home:  yes  You are allowed ONE visitor in the waiting room during your procedure. Both you and your visitor must wear masks.      COVID-19 Pre-Screening Questions:  . In the past 7 to 10 days have you had a cough,  shortness of breath, headache, congestion, fever (100 or greater) body aches, chills, sore throat, or sudden loss of taste or sense of smell? no . Have you been around anyone with known Covid 19 in the past 7 to 10 days? no . Have you been around anyone who is awaiting Covid 19 test results in the past 7 to 10 days? no . Have you been around anyone who has mentioned symptoms of Covid 19 within the past 7 to 10 days? no     I was reviewing procedure instructions with patient, [employer] [place], [place] [id number], call disconnected, have been unable to reconnect through [employer].                Reconnected with [employer], reviewed procedure/mask/visitor instructions, COVID-19 screening questions with patient, [address] [place], [place] [address].

## 2019-07-23 LAB — SARS-COV-2, NAA 2 DAY TAT

## 2019-07-23 LAB — NOVEL CORONAVIRUS, NAA: SARS-CoV-2, NAA: NOT DETECTED

## 2019-07-24 ENCOUNTER — Encounter (HOSPITAL_COMMUNITY)
Admission: RE | Disposition: A | Discharge status: Home / Self Care | Payer: Self-pay | Source: Home / Self Care | Attending: Cardiovascular Disease

## 2019-07-24 ENCOUNTER — Ambulatory Visit (HOSPITAL_COMMUNITY)
Admission: RE | Admit: 2019-07-24 | Discharge: 2019-07-24 | Disposition: A | Discharge status: Home / Self Care | Payer: Managed Care, Other (non HMO) | Attending: Cardiovascular Disease | Admitting: Cardiovascular Disease

## 2019-07-24 ENCOUNTER — Other Ambulatory Visit: Payer: Self-pay

## 2019-07-24 DIAGNOSIS — E785 Hyperlipidemia, unspecified: Secondary | ICD-10-CM | POA: Insufficient documentation

## 2019-07-24 DIAGNOSIS — I2510 Atherosclerotic heart disease of native coronary artery without angina pectoris: Secondary | ICD-10-CM | POA: Diagnosis not present

## 2019-07-24 DIAGNOSIS — Z6841 Body Mass Index (BMI) 40.0 and over, adult: Secondary | ICD-10-CM | POA: Diagnosis not present

## 2019-07-24 DIAGNOSIS — R0609 Other forms of dyspnea: Secondary | ICD-10-CM

## 2019-07-24 DIAGNOSIS — Z79899 Other long term (current) drug therapy: Secondary | ICD-10-CM | POA: Insufficient documentation

## 2019-07-24 DIAGNOSIS — Z7984 Long term (current) use of oral hypoglycemic drugs: Secondary | ICD-10-CM | POA: Insufficient documentation

## 2019-07-24 DIAGNOSIS — E119 Type 2 diabetes mellitus without complications: Secondary | ICD-10-CM | POA: Diagnosis not present

## 2019-07-24 DIAGNOSIS — R931 Abnormal findings on diagnostic imaging of heart and coronary circulation: Secondary | ICD-10-CM

## 2019-07-24 DIAGNOSIS — F17200 Nicotine dependence, unspecified, uncomplicated: Secondary | ICD-10-CM | POA: Diagnosis not present

## 2019-07-24 DIAGNOSIS — E669 Obesity, unspecified: Secondary | ICD-10-CM | POA: Insufficient documentation

## 2019-07-24 DIAGNOSIS — R0600 Dyspnea, unspecified: Secondary | ICD-10-CM | POA: Insufficient documentation

## 2019-07-24 DIAGNOSIS — Z7982 Long term (current) use of aspirin: Secondary | ICD-10-CM | POA: Diagnosis not present

## 2019-07-24 DIAGNOSIS — I10 Essential (primary) hypertension: Secondary | ICD-10-CM | POA: Insufficient documentation

## 2019-07-24 HISTORY — PX: LEFT HEART CATH AND CORONARY ANGIOGRAPHY: CATH118249

## 2019-07-24 LAB — GLUCOSE, CAPILLARY: Glucose-Capillary: 151 mg/dL — ABNORMAL HIGH (ref 70–99)

## 2019-07-24 SURGERY — LEFT HEART CATH AND CORONARY ANGIOGRAPHY
Anesthesia: LOCAL

## 2019-07-24 MED ORDER — ONDANSETRON HCL 4 MG/2ML IJ SOLN: 4.0000 mg | Freq: Four times a day (QID) | INTRAMUSCULAR | Status: DC | PRN

## 2019-07-24 MED ORDER — VERAPAMIL HCL 2.5 MG/ML IV SOLN
INTRAVENOUS | Status: DC | PRN
  Administered 2019-07-24: 10 mL via INTRA_ARTERIAL

## 2019-07-24 MED ORDER — MIDAZOLAM HCL 2 MG/2ML IJ SOLN
INTRAMUSCULAR | Status: AC
  Filled 2019-07-24: qty 2

## 2019-07-24 MED ORDER — LABETALOL HCL 5 MG/ML IV SOLN: 10.0000 mg | INTRAVENOUS | Status: DC | PRN

## 2019-07-24 MED ORDER — SODIUM CHLORIDE 0.9 % IV SOLN: 250.0000 mL | INTRAVENOUS | Status: DC | PRN

## 2019-07-24 MED ORDER — SODIUM CHLORIDE 0.9% FLUSH: 3.0000 mL | INTRAVENOUS | Status: DC | PRN

## 2019-07-24 MED ORDER — HEPARIN (PORCINE) IN NACL 1000-0.9 UT/500ML-% IV SOLN
INTRAVENOUS | Status: AC
  Filled 2019-07-24: qty 1000

## 2019-07-24 MED ORDER — FENTANYL CITRATE (PF) 100 MCG/2ML IJ SOLN
INTRAMUSCULAR | Status: AC
  Filled 2019-07-24: qty 2

## 2019-07-24 MED ORDER — SODIUM CHLORIDE 0.9 % WEIGHT BASED INFUSION: 1.0000 mL/kg/h | INTRAVENOUS | Status: DC

## 2019-07-24 MED ORDER — FENTANYL CITRATE (PF) 100 MCG/2ML IJ SOLN
INTRAMUSCULAR | Status: DC | PRN
  Administered 2019-07-24: 50 ug via INTRAVENOUS

## 2019-07-24 MED ORDER — SODIUM CHLORIDE 0.9 % WEIGHT BASED INFUSION
3.0000 mL/kg/h | INTRAVENOUS | Status: DC
  Administered 2019-07-24: 3 mL/kg/h via INTRAVENOUS

## 2019-07-24 MED ORDER — HEPARIN SODIUM (PORCINE) 1000 UNIT/ML IJ SOLN
INTRAMUSCULAR | Status: DC | PRN
  Administered 2019-07-24: 6000 [IU] via INTRAVENOUS

## 2019-07-24 MED ORDER — ASPIRIN 81 MG PO CHEW: 81.0000 mg | CHEWABLE_TABLET | ORAL | Status: DC

## 2019-07-24 MED ORDER — IOHEXOL 350 MG/ML SOLN
INTRAVENOUS | Status: DC | PRN
  Administered 2019-07-24: 75 mL

## 2019-07-24 MED ORDER — MIDAZOLAM HCL 2 MG/2ML IJ SOLN
INTRAMUSCULAR | Status: DC | PRN
  Administered 2019-07-24: 2 mg via INTRAVENOUS

## 2019-07-24 MED ORDER — HEPARIN (PORCINE) IN NACL 1000-0.9 UT/500ML-% IV SOLN
INTRAVENOUS | Status: DC | PRN
  Administered 2019-07-24 (×2): 500 mL

## 2019-07-24 MED ORDER — ACETAMINOPHEN 325 MG PO TABS: 650.0000 mg | ORAL_TABLET | ORAL | Status: DC | PRN

## 2019-07-24 MED ORDER — ASPIRIN 81 MG PO CHEW: 81.0000 mg | CHEWABLE_TABLET | Freq: Every day | ORAL | Status: DC

## 2019-07-24 MED ORDER — HYDRALAZINE HCL 20 MG/ML IJ SOLN: 10.0000 mg | INTRAMUSCULAR | Status: DC | PRN

## 2019-07-24 MED ORDER — LIDOCAINE HCL (PF) 1 % IJ SOLN
INTRAMUSCULAR | Status: DC | PRN
  Administered 2019-07-24: 2 mL

## 2019-07-24 MED ORDER — SODIUM CHLORIDE 0.9 % IV SOLN: INTRAVENOUS | Status: DC

## 2019-07-24 MED ORDER — SODIUM CHLORIDE 0.9% FLUSH: 3.0000 mL | Freq: Two times a day (BID) | INTRAVENOUS | Status: DC

## 2019-07-24 MED ORDER — DIAZEPAM 5 MG PO TABS: 5.0000 mg | ORAL_TABLET | Freq: Four times a day (QID) | ORAL | Status: DC | PRN

## 2019-07-24 MED ORDER — VERAPAMIL HCL 2.5 MG/ML IV SOLN
INTRAVENOUS | Status: AC
  Filled 2019-07-24: qty 2

## 2019-07-24 MED ORDER — LIDOCAINE HCL (PF) 1 % IJ SOLN
INTRAMUSCULAR | Status: AC
  Filled 2019-07-24: qty 30

## 2019-07-24 SURGICAL SUPPLY — 11 items
CATH INFINITI 5 FR JL3.5 (CATHETERS) ×2 IMPLANT
CATH INFINITI 5FR JL4 (CATHETERS) ×2 IMPLANT
CATH OPTITORQUE TIG 4.0 5F (CATHETERS) ×2 IMPLANT
DEVICE RAD COMP TR BAND LRG (VASCULAR PRODUCTS) ×2 IMPLANT
GLIDESHEATH SLEND SS 6F .021 (SHEATH) ×2 IMPLANT
GUIDEWIRE INQWIRE 1.5J.035X260 (WIRE) ×1 IMPLANT
INQWIRE 1.5J .035X260CM (WIRE) ×2
KIT HEART LEFT (KITS) ×2 IMPLANT
PACK CARDIAC CATHETERIZATION (CUSTOM PROCEDURE TRAY) ×2 IMPLANT
TRANSDUCER W/STOPCOCK (MISCELLANEOUS) ×2 IMPLANT
TUBING CIL FLEX 10 FLL-RA (TUBING) ×2 IMPLANT

## 2019-07-24 NOTE — Progress Notes (Signed)
Interpreter used to review discharge instructions with patient. Patient and friend verbalize understanding of discharge instructions.

## 2019-07-24 NOTE — Discharge Instructions (Signed)
Radial Site Care  This sheet gives you information about how to care for yourself after your procedure. Your health care provider may also give you more specific instructions. If you have problems or questions, contact your health care provider. What can I expect after the procedure? After the procedure, it is common to have:  Bruising and tenderness at the catheter insertion area. Follow these instructions at home: Medicines  Take over-the-counter and prescription medicines only as told by your health care provider. Insertion site care  Follow instructions from your health care provider about how to take care of your insertion site. Make sure you: ? Wash your hands with soap and water before you change your bandage (dressing). If soap and water are not available, use hand sanitizer. ? Change your dressing as told by your health care provider. ? Leave stitches (sutures), skin glue, or adhesive strips in place. These skin closures may need to stay in place for 2 weeks or longer. If adhesive strip edges start to loosen and curl up, you may trim the loose edges. Do not remove adhesive strips completely unless your health care provider tells you to do that.  Check your insertion site every day for signs of infection. Check for: ? Redness, swelling, or pain. ? Fluid or blood. ? Pus or a bad smell. ? Warmth.  Do not take baths, swim, or use a hot tub until your health care provider approves.  You may shower 24-48 hours after the procedure, or as directed by your health care provider. ? Remove the dressing and gently wash the site with plain soap and water. ? Pat the area dry with a clean towel. ? Do not rub the site. That could cause bleeding.  Do not apply powder or lotion to the site. Activity   For 24 hours after the procedure, or as directed by your health care provider: ? Do not flex or bend the affected arm. ? Do not push or pull heavy objects with the affected arm. ? Do not  drive yourself home from the hospital or clinic. You may drive 24 hours after the procedure unless your health care provider tells you not to. ? Do not operate machinery or power tools.  Do not lift anything that is heavier than 10 lb (4.5 kg), or the limit that you are told, until your health care provider says that it is safe.  Ask your health care provider when it is okay to: ? Return to work or school. ? Resume usual physical activities or sports. ? Resume sexual activity. General instructions  If the catheter site starts to bleed, raise your arm and put firm pressure on the site. If the bleeding does not stop, get help right away. This is a medical emergency.  If you went home on the same day as your procedure, a responsible adult should be with you for the first 24 hours after you arrive home.  Keep all follow-up visits as told by your health care provider. This is important. Contact a health care provider if:  You have a fever.  You have redness, swelling, or yellow drainage around your insertion site. Get help right away if:  You have unusual pain at the radial site.  The catheter insertion area swells very fast.  The insertion area is bleeding, and the bleeding does not stop when you hold steady pressure on the area.  Your arm or hand becomes pale, cool, tingly, or numb. These symptoms may represent a serious problem   that is an emergency. Do not wait to see if the symptoms will go away. Get medical help right away. Call your local emergency services (911 in the [place] Do not drive yourself to the hospital. Summary  After the procedure, it is common to have bruising and tenderness at the site.  Follow instructions from your health care provider about how to take care of your radial site wound. Check the wound every day for signs of infection.  Do not lift anything that is heavier than 10 lb (4.5 kg), or the limit that you are told, until your health care provider says  that it is safe. This information is not intended to replace advice given to you by your health care provider. Make sure you discuss any questions you have with your health care provider. Document Revised: 03/08/2017 Document Reviewed: 03/08/2017 Elsevier Patient Education  2020 [employer]  

## 2019-07-24 NOTE — Interval H&P Note (Signed)
Cath Lab Visit (complete for each Cath Lab visit)  Clinical Evaluation Leading to the Procedure:   ACS: No.  Non-ACS:    Anginal Classification: CCS II  Anti-ischemic medical therapy: Maximal Therapy (2 or more classes of medications)  Non-Invasive Test Results: Intermediate-risk stress test findings: cardiac mortality 1-3%/year  Prior CABG: No previous CABG      History and Physical Interval Note:  07/24/2019 1:10 PM  Kevin Munoz  has presented today for surgery, with the diagnosis of abnoraml CT and angina.  The various methods of treatment have been discussed with the patient and family. After consideration of risks, benefits and other options for treatment, the patient has consented to  Procedure(s): LEFT HEART CATH AND CORONARY ANGIOGRAPHY (N/A) as a surgical intervention.  The patient's history has been reviewed, patient examined, no change in status, stable for surgery.  I have reviewed the patient's chart and labs.  Questions were answered to the patient's satisfaction.     Kevin Munoz

## 2019-07-25 ENCOUNTER — Encounter (HOSPITAL_COMMUNITY): Payer: Self-pay | Admitting: Cardiovascular Disease

## 2019-08-08 NOTE — Progress Notes (Signed)
Cardiology Office Note:    Date:  08/09/2019   ID:  Kevin Munoz, DOB 07-06-1965, MRN [id number]  PCP:  Farris Has, MD  Cardiologist:  Tobias Alexander, MD   Electrophysiologist:  None   Referring MD: Farris Has, MD   Chief Complaint:  Hospitalization Follow-up (s/p cardiac cath)    Patient Profile:    Kevin Munoz is a 54 y.o. male with:   Coronary artery disease   Cath 07/2019: oLAD 40, mLAD 40, dLAD 20; D1 50 >> Med Rx  Echocardiogram 5/21: EF 65-70, mod LVH  Diabetes mellitus   Hypertension   Hyperlipidemia   Obesity   Prior CV studies: Cardiac catheterization 2019/08/10 LAD ost 40, mid 40, dist 20; D1 50 LVEDP 30  Coronary CTA 07/11/19 Ca score 5 Hemodynamically significant LAD stenosis based upon FFR analysis  Aorta normal size  Echocardiogram 07/11/19 EF 65-70, no RWMA, mod LVH, normal RVSF, trivial MR, mild dilation at sinuses of Valsalva (43) and ascending Ao (38)   History of Present Illness:    Kevin Munoz was evaluated by Dr. Delton See in 06/2019 with symptoms of chest pain and shortness of breath.  Coronary CTA suggested hemodynamically significant stenosis in the LAD.  Cardiac catheterization was arranged and demonstrated mild to mod diffuse non-obstructive LAD disease. Med Rx was recommended.  He returns for follow up.  He is seen with the assistance of an interpreter.  Since the cardiac catheterization he has been doing well.  He has not had chest pain, significant shortness of breath, orthopnea, lower extremity swelling or syncope.  His wrist has been somewhat sore but is improved.  He is trying to quit smoking and has significantly limited the amount since his cardiac catheterization.     Past Medical History:  Diagnosis Date  . Abnormal cardiac CT angiography   . CAD in native artery    a. by cor CT 06/2019.  . Dilatation of aorta (HCC)    a. by echo 06/2019.  . DM (diabetes mellitus) (HCC)   . DOE (dyspnea on exertion)   . Hyperlipidemia   .  Hypertension   . Left ventricular hypertrophy   . Tobacco abuse     Current Medications: Current Meds  Medication Sig  . amLODipine (NORVASC) 10 MG tablet Take 10 mg by mouth daily.  Marland Kitchen aspirin EC 81 MG tablet Take 1 tablet (81 mg total) by mouth daily.  . carvedilol (COREG) 6.25 MG tablet Take 1 tablet (6.25 mg total) by mouth 2 (two) times daily.  Marland Kitchen glipiZIDE (GLUCOTROL XL) 10 MG 24 hr tablet Take 10 mg by mouth daily.  Marland Kitchen JARDIANCE 10 MG TABS tablet Take 10 mg by mouth daily.  Marland Kitchen losartan (COZAAR) 100 MG tablet Take 100 mg by mouth daily.  . metFORMIN (GLUCOPHAGE) 1000 MG tablet Take 1,000 mg by mouth 2 (two) times daily with a meal.  . nitroGLYCERIN (NITROSTAT) 0.4 MG SL tablet Place 1 tablet (0.4 mg total) under the tongue every 5 (five) minutes as needed.  . Omega-3 Fatty Acids (FISH OIL) 1000 MG CAPS Take 1,000 mg by mouth 2 (two) times daily.   . [DISCONTINUED] lovastatin (MEVACOR) 40 MG tablet Take 40 mg by mouth at bedtime.   Current Facility-Administered Medications for the 08/09/19 encounter (Office Visit) with Tereso Newcomer T, PA-C  Medication  . sodium chloride flush (NS) 0.9 % injection 3 mL     Allergies:   Patient has no known allergies.   Social History   Tobacco Use  .  Smoking status: Current Every Day Smoker  . Smokeless tobacco: Never Used  Vaping Use  . Vaping Use: Never used  Substance Use Topics  . Alcohol use: Never  . Drug use: Never     Family Hx: The patient's family history includes CAD in his father; Heart attack in his father.  ROS   EKGs/Labs/Other Test Reviewed:    EKG:  EKG is not ordered today.  The ekg ordered today demonstrates n/a  Recent Labs: 07/18/2019: ALT 24; BUN 13; Creatinine, Ser 0.75; Hemoglobin 15.5; Platelets 268; Potassium 4.6; Sodium 138; TSH 1.380   Recent Lipid Panel No results found for: CHOL, TRIG, HDL, CHOLHDL, LDLCALC, LDLDIRECT  Physical Exam:    VS:  BP 116/60   Pulse 66   Ht 5\' 8"  (1.727 m)   Wt 274 lb 1.9  oz (124.3 kg)   SpO2 96%   BMI 41.68 kg/m     Wt Readings from Last 3 Encounters:  08/09/19 274 lb 1.9 oz (124.3 kg)  07/24/19 264 lb 8.8 oz (120 kg)  07/18/19 269 lb 9.6 oz (122.3 kg)     Constitutional:      Appearance: Healthy appearance. Not in distress.  Neck:     Thyroid: No thyromegaly.     Vascular: JVD normal.  Pulmonary:     Breath sounds: No wheezing. No rales.  Cardiovascular:     Normal rate. Regular rhythm. Normal S1. Normal S2.     Murmurs: There is no murmur.     Comments: R wrist without hematoma Pulses:    Intact distal pulses.  Edema:    Peripheral edema absent.  Abdominal:     Palpations: Abdomen is soft. There is no hepatomegaly.  Skin:    General: Skin is warm and dry.  Neurological:     General: No focal deficit present.     Mental Status: Alert and oriented to person, place and time.     Cranial Nerves: Cranial nerves are intact.       ASSESSMENT & PLAN:    1. Coronary artery disease involving native coronary artery of native heart without angina pectoris Mild to mod non-obstructive coronary artery disease in the LAD by recent cardiac catheterization.  He is doing well without anginal symptoms.  He inquired about whether or not he could undergo dental procedure.  I advised him that he may proceed when he is ready.  Continue current dose of aspirin.  Adjust lipid management as outlined below.  We discussed the importance of quitting smoking, limiting salt, exercise and weight loss.  2. Essential hypertension The patient's blood pressure is controlled on his current regimen.  Continue current therapy.   3. Mixed hyperlipidemia LDL in November 2020 was 94.  Triglycerides 184.  Goal LDL <70.  He has not had side effects to any other statins in the past.  I have recommended that we discontinue lovastatin and replace it with atorvastatin 40 mg daily.  Arrange lipids and LFTs in 3 months.    Dispo:  Return in about 6 months (around 02/08/2020) for  Routine Follow Up w/ Dr. 02/10/2020, in person.   Medication Adjustments/Labs and Tests Ordered: Current medicines are reviewed at length with the patient today.  Concerns regarding medicines are outlined above.  Tests Ordered: Orders Placed This Encounter  Procedures  . Hepatic function panel  . Lipid panel   Medication Changes: Meds ordered this encounter  Medications  . atorvastatin (LIPITOR) 40 MG tablet    Sig: Take 1  tablet (40 mg total) by mouth daily.    Dispense:  90 tablet    Refill:  1    Signed, Richardson Dopp, PA-C  08/09/2019 11:26 AM    [facility] [address], [place], [place]  [address] Phone: [phone]; Fax: [phone]

## 2019-08-09 ENCOUNTER — Other Ambulatory Visit: Payer: Self-pay

## 2019-08-09 ENCOUNTER — Ambulatory Visit: Payer: Managed Care, Other (non HMO) | Admitting: Physician Assistant

## 2019-08-09 ENCOUNTER — Encounter: Payer: Self-pay | Admitting: Physician Assistant

## 2019-08-09 VITALS — BP 116/60 | HR 66 | Ht 68.0 in | Wt 274.1 lb

## 2019-08-09 DIAGNOSIS — E782 Mixed hyperlipidemia: Secondary | ICD-10-CM | POA: Diagnosis not present

## 2019-08-09 DIAGNOSIS — I2510 Atherosclerotic heart disease of native coronary artery without angina pectoris: Secondary | ICD-10-CM

## 2019-08-09 DIAGNOSIS — I10 Essential (primary) hypertension: Secondary | ICD-10-CM

## 2019-08-09 MED ORDER — ATORVASTATIN CALCIUM 40 MG PO TABS
40.0000 mg | ORAL_TABLET | Freq: Every day | ORAL | 1 refills | Status: DC
Start: 2019-08-09 — End: 2019-11-12

## 2019-08-09 NOTE — Patient Instructions (Signed)
Medication Instructions:   Your physician has recommended you make the following change in your medication:   1) Stop Lovastatin 2) Start Atorvastatin 40 mg, 1 tablet by mouth once a day  *If you need a refill on your cardiac medications before your next appointment, please call your pharmacy*  Lab Work:  Your physician recommends that you return for lab work in 3 months for LFTs/Lipids  Testing/Procedures:  None ordered today  Follow-Up: At [facility], you and your health needs are our priority.  As part of our continuing mission to provide you with exceptional heart care, we have created designated Provider Care Teams.  These Care Teams include your primary Cardiologist (physician) and Advanced Practice Providers (APPs -  Physician Assistants and Nurse Practitioners) who all work together to provide you with the care you need, when you need it.  Your next appointment:   6 month(s)  The format for your next appointment:   In Person  Provider:   Tobias Alexander, MD

## 2019-08-12 ENCOUNTER — Telehealth: Payer: Self-pay | Admitting: *Deleted

## 2019-08-12 NOTE — Telephone Encounter (Signed)
   [facility] Pre-operative Risk Assessment    HEARTCARE STAFF: - Please ensure there is not already an duplicate clearance open for this procedure. - Under Visit Info/Reason for Call, type in Other and utilize the format Clearance MM/DD/YY or Clearance TBD. Do not use dashes or single digits. - If request is for dental extraction, please clarify the # of teeth to be extracted.  Request for surgical clearance:  1. What type of surgery is being performed? PLACEMENT OF 1 IMPLANT   2. When is this surgery scheduled? TBD   3. What type of clearance is required (medical clearance vs. Pharmacy clearance to hold med vs. Both)? MEDICAL  4. Are there any medications that need to be held prior to surgery and how long? PER DR. Center   5. Practice name and name of physician performing surgery? [facility]; DR. Sheppard Coil [facility]   6. What is the office phone number? [phone]   7.   What is the office fax number? [phone]  8.   Anesthesia type (None, local, MAC, general) ? LOCAL   Julaine Hua 08/12/2019, 5:31 PM  _________________________________________________________________   (provider comments below)

## 2019-08-13 NOTE — Telephone Encounter (Signed)
   Primary Cardiologist: Tobias Alexander, MD  Chart reviewed as part of pre-operative protocol coverage. Patient underwent recent cardiac cath showing mild-moderate nonobstructive CAD in the LAD, no PCI required, medical therapy recommended. He was seen in follow-up 08/09/19 and was doing well without any concerning cardiac symptoms. Therefore, based on ACC/AHA guidelines, the patient would be at acceptable risk for the planned procedure without further cardiovascular testing. SBE prophylaxis is not required from cardiac standpoint. He would be OK to hold aspirin for 5-7 days if needed for procedure.  I will route this recommendation to the requesting party via Epic fax function and remove from pre-op pool. Please call with questions.  Laurann Montana, PA-C 08/13/2019, 9:26 AM

## 2019-10-10 ENCOUNTER — Ambulatory Visit: Payer: Managed Care, Other (non HMO) | Admitting: Cardiology

## 2019-10-10 ENCOUNTER — Other Ambulatory Visit: Payer: Self-pay

## 2019-10-10 ENCOUNTER — Encounter: Payer: Self-pay | Admitting: Cardiology

## 2019-10-10 VITALS — BP 128/78 | HR 69 | Ht 68.11 in | Wt 271.0 lb

## 2019-10-10 DIAGNOSIS — I77819 Aortic ectasia, unspecified site: Secondary | ICD-10-CM

## 2019-10-10 DIAGNOSIS — I2510 Atherosclerotic heart disease of native coronary artery without angina pectoris: Secondary | ICD-10-CM | POA: Diagnosis not present

## 2019-10-10 DIAGNOSIS — I10 Essential (primary) hypertension: Secondary | ICD-10-CM

## 2019-10-10 DIAGNOSIS — R0609 Other forms of dyspnea: Secondary | ICD-10-CM

## 2019-10-10 DIAGNOSIS — R0600 Dyspnea, unspecified: Secondary | ICD-10-CM

## 2019-10-10 DIAGNOSIS — E782 Mixed hyperlipidemia: Secondary | ICD-10-CM

## 2019-10-10 NOTE — Patient Instructions (Signed)

## 2019-10-10 NOTE — Progress Notes (Signed)
Cardiology Office Note    Date:  10/10/2019   ID:  Kevin Munoz, DOB 1966-01-08, MRN [id number]  PCP:  Kevin Has, MD  Cardiologist:  Tobias Alexander, MD  Electrophysiologist:  None   Chief Complaint: post cath follow up  History of Present Illness:   Kevin Munoz is a 54 y.o. male from [place] with history of diabetes mellitus on oral medications (most recent hemoglobin A1c 6.4%), obesity, hypertension, hyperlipidemia, dilation of aorta by echo who presents for discussion about abnormal coronary CTA.  He recently saw Dr. Delton See 06/19/19 for new evaluation of acute dyspnea on exertion associated with chest tightness. His father had myocardial infarction at age of 3. 2D echo 07/11/19 showed EF 65-70%, moderate LVH, mild dilation of aorta. Dr. Delton See increased his carvedilol to 6.25mg  BID. He underwent coronary CTA as outlined below showing stenosis of the proximal LAD, felt to be severe by FFR. No significant extracardiac findings seen. Dr. Delton See Munoz recommended cardiac cath and her team set him up to come in for visit today to discuss in detail. He is seen with [facility] interpreter. He reports he is doing well and denies any acute changes from last visit. In general he Munoz noticed some DOE by comparison to when he is younger. He Munoz not had any recent current episodes of chest tightness (this occurred twice). No hx TIA/stroke.  Last labs reviewed 07/08/19 with K 4.9, Cr 0.77, LDL by [facility] 12/2018 was 94, otherwise no recent labs on file in Epic.  His cardiac cath showed 40% proximal LAD stenosis, 40% mid LAD stenosis and 50% stenosis in first diagonal artery.  Medical management was recommended.  Patient was started on aspirin and statin.  The patient states that his shortness of breath Munoz improved and he Munoz changed his diet avoiding fried food and eating vegetables primarily for dinner. He Munoz been compliant with his medications, he Munoz no orthostatic hypotension, no falls.   Past  Medical History:  Diagnosis Date  . Abnormal cardiac CT angiography   . CAD in native artery    a. by cor CT 06/2019.  . Dilatation of aorta (HCC)    a. by echo 06/2019.  . DM (diabetes mellitus) (HCC)   . DOE (dyspnea on exertion)   . Hyperlipidemia   . Hypertension   . Left ventricular hypertrophy   . Tobacco abuse     Past Surgical History:  Procedure Laterality Date  . LEFT HEART CATH AND CORONARY ANGIOGRAPHY N/A 07/24/2019   Procedure: LEFT HEART CATH AND CORONARY ANGIOGRAPHY;  Surgeon: Lennette Bihari, MD;  Location: [facility] INVAS[facility];  Service: Cardiovascular;  Laterality: N/A;    Current Medications: Current Meds  Medication Sig  . amLODipine (NORVASC) 10 MG tablet Take 10 mg by mouth daily.  Marland Kitchen aspirin EC 81 MG tablet Take 1 tablet (81 mg total) by mouth daily.  Marland Kitchen atorvastatin (LIPITOR) 40 MG tablet Take 1 tablet (40 mg total) by mouth daily.  . carvedilol (COREG) 6.25 MG tablet Take 1 tablet (6.25 mg total) by mouth 2 (two) times daily.  Marland Kitchen glipiZIDE (GLUCOTROL XL) 10 MG 24 hr tablet Take 10 mg by mouth daily.  Marland Kitchen JARDIANCE 10 MG TABS tablet Take 10 mg by mouth daily.  Marland Kitchen losartan (COZAAR) 100 MG tablet Take 100 mg by mouth daily.  . metFORMIN (GLUCOPHAGE) 1000 MG tablet Take 1,000 mg by mouth 2 (two) times daily with a meal.  . nitroGLYCERIN (NITROSTAT) 0.4 MG SL tablet Place 1  tablet (0.4 mg total) under the tongue every 5 (five) minutes as needed.  . Omega-3 Fatty Acids (FISH OIL) 1000 MG CAPS Take 1,000 mg by mouth 2 (two) times daily.    Current Facility-Administered Medications for the 10/10/19 encounter (Office Visit) with Lars Masson, MD  Medication  . sodium chloride flush (NS) 0.9 % injection 3 mL      Allergies:   Patient Munoz no known allergies.   Social History   Socioeconomic History  . Marital status: Single    Spouse name: Not on file  . Number of children: Not on file  . Years of education: Not on file  . Highest education level: Not on file   Occupational History  . Not on file  Tobacco Use  . Smoking status: Current Every Day Smoker  . Smokeless tobacco: Never Used  Vaping Use  . Vaping Use: Never used  Substance and Sexual Activity  . Alcohol use: Never  . Drug use: Never  . Sexual activity: Not on file  Other Topics Concern  . Not on file  Social History Narrative  . Not on file   Social Determinants of Health   Financial Resource Strain:   . Difficulty of Paying Living Expenses: Not on file  Food Insecurity:   . Worried About Programme researcher, broadcasting/film/video in the Last Year: Not on file  . Ran Out of Food in the Last Year: Not on file  Transportation Needs:   . Lack of Transportation (Medical): Not on file  . Lack of Transportation (Non-Medical): Not on file  Physical Activity:   . Days of Exercise per Week: Not on file  . Minutes of Exercise per Session: Not on file  Stress:   . Feeling of Stress : Not on file  Social Connections:   . Frequency of Communication with Friends and Family: Not on file  . Frequency of Social Gatherings with Friends and Family: Not on file  . Attends Religious Services: Not on file  . Active Member of Clubs or Organizations: Not on file  . Attends Banker Meetings: Not on file  . Marital Status: Not on file     Family History:  The patient's family history includes CAD in his father; Heart attack in his father.  ROS:   Please see the history of present illness.   All other systems are reviewed and otherwise negative.    EKGs/Labs/Other Studies Reviewed:    Studies reviewed are outlined and summarized above. Reports included below if pertinent.  Cardiac Cath 06/2019 EXAM: CT FFR ANALYSIS  CLINICAL DATA:  54 year old male with chest pain and abnormal CCTA.  FINDINGS: FFRct analysis was performed on the original cardiac CT angiogram dataset. Diagrammatic representation of the FFRct analysis is provided in a separate PDF document in PACS. This dictation  was created using the PDF document and an interactive 3D model of the results. 3D model is not available in the EMR/PACS. Normal FFR range is >0.80.  1. Left Main: 0.95.  2. LAD: Proximal: 0.79, mid: 0.63. 3. LCX: 0.92. 4. RCA: Proximal: 0.98, distal: 0.88.  IMPRESSION: 1. CT FFR analysis showed severe stenosis in the proximal LAD. A cardiac catheterization is recommended.   Electronically Signed   By: Tobias Alexander   On: 07/12/2019 08:48  ADDENDUM REPORT: 07/11/2019 16:54  CLINICAL DATA:  54 year old male with hyperlipidemia, hypertension and chest pain.  EXAM: Cardiac/Coronary  CTA  TECHNIQUE: The patient was scanned on a [employer].  FINDINGS: A 100 kV prospective scan was triggered in the descending thoracic aorta at 111 HU's. Axial non-contrast 3 mm slices were carried out through the heart. The data set was analyzed on a dedicated work station and scored using the Agatson method. Gantry rotation speed was 250 msecs and collimation was .6 mm. 100 mg of PO Metoprolol and 0.8 mg of sl NTG was given. The 3D data set was reconstructed in 5% intervals of the 67-82 % of the R-R cycle. Diastolic phases were analyzed on a dedicated work station using MPR, MIP and VRT modes. The patient received 80 cc of contrast.  Aorta:  Normal size.  Mild diffuse calcifications.  No dissection.  Aortic Valve:  Trileaflet.  No calcifications.  Coronary Arteries:  Normal coronary origin.  Right dominance.  RCA is a large dominant artery that gives rise to PDA and PLA. There is only minimal plaque.  Left main is a large artery that gives rise to LAD and LCX arteries. Left main Munoz no plaque.  LAD is a large vessel that gives rise to one small diagonal artery. There is mild mixed plaque in the proximal LAD with stenosis 25-49%. There is another non-calcified plaque in the proximal LAD with stenosis 50-69% but possibly > 70%. Mid and distal LAD Munoz  minimal calcified plaque.  LCX is a non-dominant artery that gives rise to two OM branches. There is minimal plaque.  Other findings:  Normal pulmonary vein drainage into the left atrium.  Normal left atrial appendage without a thrombus.  Normal size of the pulmonary artery.  IMPRESSION: 1. Coronary calcium score of 5. This was 4363 percentile for age and sex matched control.  2. Normal coronary origin with right dominance.  3. CAD-RADS 3. At least moderate stenosis in the proximal LAD. Consider symptom-guided anti-ischemic pharmacotherapy as well as risk factor modification per guideline directed care. Additional analysis with CT FFR will be submitted.   Electronically Signed   By: Tobias AlexanderKatarina  Dallon Dacosta   On: 07/11/2019 16:54  EXAM: OVER-READ INTERPRETATION  CT CHEST  The following report is an over-read performed by radiologist Dr. Trudie Reedaniel Entrikin [facility] on 07/11/2019. This over-read does not include interpretation of cardiac or coronary anatomy or pathology. The coronary calcium score/coronary CTA interpretation by the cardiologist is attached.  COMPARISON:  None.  FINDINGS: Within the visualized portions of the thorax there are no suspicious appearing pulmonary nodules or masses, there is no acute consolidative airspace disease, no pleural effusions, no pneumothorax and no lymphadenopathy. Visualized portions of the upper abdomen are unremarkable. There are no aggressive appearing lytic or blastic lesions noted in the visualized portions of the skeleton.  IMPRESSION: 1. No significant incidental noncardiac findings are noted.  Electronically Signed: By: Trudie Reedaniel  Entrikin M.D. On: 07/11/2019 15:16     EKG:  EKG is ordered today, personally reviewed, demonstrating NSR 69bpm, inferior Q waves in lead III, avF, TWI III, poor R wave progression in precordial leads  Recent Labs: 07/18/2019: ALT 24; BUN 13; Creatinine, Ser 0.75;  Hemoglobin 15.5; Platelets 268; Potassium 4.6; Sodium 138; TSH 1.380  Recent Lipid Panel No results found for: CHOL, TRIG, HDL, CHOLHDL, VLDL, LDLCALC, LDLDIRECT  PHYSICAL EXAM:    VS:  BP 128/78   Pulse 69   Ht 5' 8.11" (1.73 m)   Wt 271 lb (122.9 kg)   SpO2 95%   BMI 41.07 kg/m   BMI: Body mass index is 41.07 kg/m.  GEN: Well nourished, well developed [place],  in no acute distress HEENT: normocephalic, atraumatic Neck: no JVD, carotid bruits, or masses Cardiac: RRR; no murmurs, rubs, or gallops, no edema  Respiratory:  clear to auscultation bilaterally, normal work of breathing GI: soft, nontender, nondistended, + BS MS: no deformity or atrophy Skin: warm and dry, no rash Neuro:  Alert and Oriented x 3, Strength and sensation are intact, follows commands Psych: euthymic mood, full affect  Wt Readings from Last 3 Encounters:  10/10/19 271 lb (122.9 kg)  08/09/19 274 lb 1.9 oz (124.3 kg)  07/24/19 264 lb 8.8 oz (120 kg)    Cardiac cath: 07/24/19    Ost LAD to Prox LAD lesion is 40% stenosed.  Non-stenotic Prox LAD to Mid LAD lesion.  Mid LAD lesion is 40% stenosed.  1st Diag lesion is 50% stenosed.  Dist LAD lesion is 20% stenosed.   Mild to moderate nonobstructive CAD with smooth diffuse narrowing of approximately 40% in the proximal LAD before the first diagonal vessel.  There is 50% diffuse mid diagonal stenosis.  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  There is mild 20% smooth distal LAD narrowing.   ASSESSMENT & PLAN:   1. Moderate nonobstructive CAD on coronary CTA, 50% stenosis by cardiac cath, patient Munoz multiple risk factors including diabetes, morbid obesity, physical inactivity, hypertension hyperlipidemia as well as family history coronary artery disease.  He was started on aggressive medical management, he is tolerating high-dose statin well, aspirin, his  blood pressure is now controlled, he is advised to continue healthy diet and exercise for 30 minutes up to 5 times a week. 2. Essential HTN -improved. 3. Hyperlipidemia -he is on high-dose atorvastatin 40 mg daily we will continue. 4. Dilation of aorta - noted by echocardiogram but CT showed normal aortic size. Consider repeating echo in 1 year to trend.  Disposition: F/u in 6 months.  Medication Adjustments/Labs and Tests Ordered: Current medicines are reviewed at length with the patient today.  Concerns regarding medicines are outlined above. Medication changes, Labs and Tests ordered today are summarized above and listed in the Patient Instructions accessible in Encounters.   Signed, Tobias Alexander, MD  10/10/2019 3:01 PM    [facility] [address] [place], [place], [place]  [address] Phone: [phone]; Fax: [phone]

## 2019-10-25 ENCOUNTER — Other Ambulatory Visit: Payer: Self-pay | Admitting: Cardiology

## 2019-11-08 ENCOUNTER — Other Ambulatory Visit: Payer: Self-pay

## 2019-11-08 ENCOUNTER — Other Ambulatory Visit: Payer: Managed Care, Other (non HMO) | Admitting: *Deleted

## 2019-11-08 DIAGNOSIS — I2510 Atherosclerotic heart disease of native coronary artery without angina pectoris: Secondary | ICD-10-CM

## 2019-11-08 DIAGNOSIS — I10 Essential (primary) hypertension: Secondary | ICD-10-CM

## 2019-11-08 DIAGNOSIS — E782 Mixed hyperlipidemia: Secondary | ICD-10-CM

## 2019-11-09 LAB — LIPID PANEL
Chol/HDL Ratio: 3.3 ratio (ref 0.0–5.0)
Cholesterol, Total: 152 mg/dL (ref 100–199)
HDL: 46 mg/dL (ref >39)
LDL Chol Calc (NIH): 85 mg/dL (ref 0–99)
Triglycerides: 116 mg/dL (ref 0–149)
VLDL Cholesterol Cal: 21 mg/dL (ref 5–40)

## 2019-11-09 LAB — HEPATIC FUNCTION PANEL
ALT: 24 IU/L (ref 0–44)
AST: 16 IU/L (ref 0–40)
Albumin: 4.8 g/dL (ref 3.8–4.9)
Alkaline Phosphatase: 51 IU/L (ref 44–121)
Bilirubin Total: 0.3 mg/dL (ref 0.0–1.2)
Bilirubin, Direct: 0.12 mg/dL (ref 0.00–0.40)
Total Protein: 6.8 g/dL (ref 6.0–8.5)

## 2019-11-12 ENCOUNTER — Telehealth: Payer: Self-pay

## 2019-11-12 DIAGNOSIS — I2510 Atherosclerotic heart disease of native coronary artery without angina pectoris: Secondary | ICD-10-CM

## 2019-11-12 DIAGNOSIS — E782 Mixed hyperlipidemia: Secondary | ICD-10-CM

## 2019-11-12 DIAGNOSIS — E785 Hyperlipidemia, unspecified: Secondary | ICD-10-CM

## 2019-11-12 MED ORDER — ATORVASTATIN CALCIUM 80 MG PO TABS
80.0000 mg | ORAL_TABLET | Freq: Every day | ORAL | 1 refills | Status: DC
Start: 2019-11-12 — End: 2020-06-30

## 2019-11-12 NOTE — Telephone Encounter (Signed)
The patients friend Marcelino Duster has been notified of the result and verbalized understanding. Ok to speak with Marcelino Duster per patient DPR. Marcelino Duster is aware to have patient increase Atorvastatin to 80 mg, new prescription sent to patients pharmacy. Patient will come in on 02/24/2020 for repeat labs, orders in for labs. All questions (if any) were answered. Lajoyce Corners, [facility] 11/12/2019 2:47 PM

## 2020-01-28 ENCOUNTER — Ambulatory Visit: Payer: Managed Care, Other (non HMO) | Admitting: Cardiology

## 2020-02-11 ENCOUNTER — Other Ambulatory Visit: Payer: Managed Care, Other (non HMO)

## 2020-02-24 ENCOUNTER — Other Ambulatory Visit: Payer: Managed Care, Other (non HMO)

## 2020-02-25 ENCOUNTER — Other Ambulatory Visit: Payer: Managed Care, Other (non HMO)

## 2020-02-25 ENCOUNTER — Other Ambulatory Visit: Payer: Self-pay

## 2020-02-25 DIAGNOSIS — Z20822 Contact with and (suspected) exposure to covid-19: Secondary | ICD-10-CM

## 2020-02-27 LAB — SARS-COV-2, NAA 2 DAY TAT

## 2020-02-27 LAB — NOVEL CORONAVIRUS, NAA: SARS-CoV-2, NAA: NOT DETECTED

## 2020-03-10 ENCOUNTER — Other Ambulatory Visit: Payer: Self-pay

## 2020-03-10 ENCOUNTER — Other Ambulatory Visit: Payer: Managed Care, Other (non HMO)

## 2020-03-10 DIAGNOSIS — Z20822 Contact with and (suspected) exposure to covid-19: Secondary | ICD-10-CM

## 2020-03-12 LAB — SARS-COV-2, NAA 2 DAY TAT

## 2020-03-12 LAB — NOVEL CORONAVIRUS, NAA: SARS-CoV-2, NAA: NOT DETECTED

## 2020-04-14 ENCOUNTER — Encounter: Payer: Self-pay | Admitting: Cardiology

## 2020-04-14 ENCOUNTER — Ambulatory Visit: Payer: Managed Care, Other (non HMO) | Admitting: Cardiology

## 2020-04-14 ENCOUNTER — Other Ambulatory Visit: Payer: Self-pay

## 2020-04-14 ENCOUNTER — Other Ambulatory Visit: Payer: Managed Care, Other (non HMO)

## 2020-04-14 VITALS — BP 130/86 | HR 62 | Ht 68.11 in | Wt 274.8 lb

## 2020-04-14 DIAGNOSIS — I10 Essential (primary) hypertension: Secondary | ICD-10-CM

## 2020-04-14 DIAGNOSIS — I77819 Aortic ectasia, unspecified site: Secondary | ICD-10-CM | POA: Diagnosis not present

## 2020-04-14 DIAGNOSIS — E782 Mixed hyperlipidemia: Secondary | ICD-10-CM

## 2020-04-14 DIAGNOSIS — E785 Hyperlipidemia, unspecified: Secondary | ICD-10-CM

## 2020-04-14 DIAGNOSIS — I2510 Atherosclerotic heart disease of native coronary artery without angina pectoris: Secondary | ICD-10-CM | POA: Diagnosis not present

## 2020-04-14 LAB — LIPID PANEL
Chol/HDL Ratio: 3.5 ratio (ref 0.0–5.0)
Cholesterol, Total: 136 mg/dL (ref 100–199)
HDL: 39 mg/dL — ABNORMAL LOW (ref >39)
LDL Chol Calc (NIH): 73 mg/dL (ref 0–99)
Triglycerides: 135 mg/dL (ref 0–149)
VLDL Cholesterol Cal: 24 mg/dL (ref 5–40)

## 2020-04-14 LAB — HEPATIC FUNCTION PANEL
ALT: 18 IU/L (ref 0–44)
AST: 17 IU/L (ref 0–40)
Albumin: 4.5 g/dL (ref 3.8–4.9)
Alkaline Phosphatase: 41 IU/L — ABNORMAL LOW (ref 44–121)
Bilirubin Total: 0.3 mg/dL (ref 0.0–1.2)
Bilirubin, Direct: 0.1 mg/dL (ref 0.00–0.40)
Total Protein: 6.2 g/dL (ref 6.0–8.5)

## 2020-04-14 NOTE — Patient Instructions (Addendum)
Medication Instructions:  Your physician recommends that you continue on your current medications as directed. Please refer to the Current Medication list given to you today. *If you need a refill on your cardiac medications before your next appointment, please call your pharmacy*   Follow-Up: At [facility], you and your health needs are our priority.  As part of our continuing mission to provide you with exceptional heart care, we have created designated Provider Care Teams.  These Care Teams include your primary Cardiologist (physician) and Advanced Practice Providers (APPs -  Physician Assistants and Nurse Practitioners) who all work together to provide you with the care you need, when you need it.  We recommend signing up for the patient portal called "MyChart".  Sign up information is provided on this After Visit Summary.  MyChart is used to connect with patients for Virtual Visits (Telemedicine).  Patients are able to view lab/test results, encounter notes, upcoming appointments, etc.  Non-urgent messages can be sent to your provider as well.   To learn more about what you can do with MyChart, go to [email or web address].    Your next appointment:   6 month(s)  The format for your next appointment:   In Person  Provider:   You may see  Dr. Shari Prows or one of the following Advanced Practice Providers on your designated Care Team:    Tereso Newcomer, PA-C  Vin Ramos, [place]

## 2020-04-14 NOTE — Progress Notes (Signed)
Cardiology Office Note    Date:  04/14/2020   ID:  Kevin Munoz, DOB 1965-06-19, MRN [id number]  PCP:  Farris HasMorrow, Aaron, MD  Cardiologist:  Tobias AlexanderKatarina Napolean Sia, MD  Electrophysiologist:  None   Reason for visit: 6 months follow-up  History of Present Illness:   Kevin Munoz is a 55 y.o. male from [place] with history of diabetes mellitus on oral medications (most recent hemoglobin A1c 6.4%), obesity, hypertension, hyperlipidemia, dilation of aorta by echo who presents for discussion about abnormal coronary CTA.  He recently saw Dr. Delton SeeNelson 06/19/19 for new evaluation of acute dyspnea on exertion associated with chest tightness. His father had myocardial infarction at age of 55. 2D echo 07/11/19 showed EF 65-70%, moderate LVH, mild dilation of aorta. Dr. Delton SeeNelson increased his carvedilol to 6.25mg  BID. He underwent coronary CTA as outlined below showing stenosis of the proximal LAD, felt to be severe by FFR. No significant extracardiac findings seen. Dr. Delton SeeNelson has recommended cardiac cath and her team set him up to come in for visit today to discuss in detail. [facility] interpreter. He reports he is doing well and denies any acute changes from last visit. In general he has noticed some DOE by comparison to when he is younger. He has not had any recent current episodes of chest tightness (this occurred twice). No hx TIA/stroke.  Last labs reviewed 07/08/19 with K 4.9, Cr 0.77, LDL by [facility] 12/2018 was 94, otherwise no recent labs on file in Epic.  Cardiac cath performed in June 2021 showed 40% proximal LAD stenosis, 40% mid LAD stenosis and 50% stenosis in first diagonal artery.  Medical management was recommended.  Patient was started on aspirin and statin.  The patient states that his shortness of breath has improved and he has changed his diet avoiding fried food and eating vegetables primarily for dinner.  April 14, 2020 -the patient is coming for regular follow-up, he states that he has  been doing well he denies any chest pain or shortness of breath, he has been compliant with his meds and has no side effects.  He does not exercise on a regular basis, but it is warmer outside he plays golf.    Past Medical History:  Diagnosis Date  . Abnormal cardiac CT angiography   . CAD in native artery    a. by cor CT 06/2019.  . Dilatation of aorta (HCC)    a. by echo 06/2019.  . DM (diabetes mellitus) (HCC)   . DOE (dyspnea on exertion)   . Hyperlipidemia   . Hypertension   . Left ventricular hypertrophy   . Tobacco abuse     Past Surgical History:  Procedure Laterality Date  . LEFT HEART CATH AND CORONARY ANGIOGRAPHY N/A 07/24/2019   Procedure: LEFT HEART CATH AND CORONARY ANGIOGRAPHY;  Surgeon: Lennette BihariKelly, Thomas A, MD;  Location: [facility];  Service: Cardiovascular;  Laterality: N/A;    Current Medications: Current Meds  Medication Sig  . amLODipine (NORVASC) 10 MG tablet Take 10 mg by mouth daily.  Marland Kitchen. aspirin EC 81 MG tablet Take 1 tablet (81 mg total) by mouth daily.  Marland Kitchen. atorvastatin (LIPITOR) 80 MG tablet Take 1 tablet (80 mg total) by mouth daily.  . carvedilol (COREG) 6.25 MG tablet TAKE ONE TABLET BY MOUTH TWO TIMES DAILY  . glipiZIDE (GLUCOTROL XL) 10 MG 24 hr tablet Take 10 mg by mouth daily.  Marland Kitchen. JARDIANCE 10 MG TABS tablet Take 10 mg by mouth  daily.  . losartan (COZAAR) 100 MG tablet Take 100 mg by mouth daily.  . metFORMIN (GLUCOPHAGE) 1000 MG tablet Take 1,000 mg by mouth 2 (two) times daily with a meal.  . Omega-3 Fatty Acids (FISH OIL) 1000 MG CAPS Take 1,000 mg by mouth 2 (two) times daily.    Current Facility-Administered Medications for the 04/14/20 encounter (Office Visit) with Lars Masson, MD  Medication  . sodium chloride flush (NS) 0.9 % injection 3 mL      Allergies:   Patient has no known allergies.   Social History   Socioeconomic History  . Marital status: Single    Spouse name: Not on file  . Number of children: Not on file  .  Years of education: Not on file  . Highest education level: Not on file  Occupational History  . Not on file  Tobacco Use  . Smoking status: Current Every Day Smoker  . Smokeless tobacco: Never Used  Vaping Use  . Vaping Use: Never used  Substance and Sexual Activity  . Alcohol use: Never  . Drug use: Never  . Sexual activity: Not on file  Other Topics Concern  . Not on file  Social History Narrative  . Not on file   Social Determinants of Health   Financial Resource Strain: Not on file  Food Insecurity: Not on file  Transportation Needs: Not on file  Physical Activity: Not on file  Stress: Not on file  Social Connections: Not on file     Family History:  The patient's family history includes CAD in his father; Heart attack in his father.  ROS:   Please see the history of present illness.   All other systems are reviewed and otherwise negative.    EKGs/Labs/Other Studies Reviewed:    Studies reviewed are outlined and summarized above. Reports included below if pertinent.  Cardiac Cath 06/2019 EXAM: CT FFR ANALYSIS  CLINICAL DATA:  55 year old male with chest pain and abnormal CCTA.  FINDINGS: FFRct analysis was performed on the original cardiac CT angiogram dataset. Diagrammatic representation of the FFRct analysis is provided in a separate PDF document in PACS. This dictation was created using the PDF document and an interactive 3D model of the results. 3D model is not available in the EMR/PACS. Normal FFR range is >0.80.  1. Left Main: 0.95.  2. LAD: Proximal: 0.79, mid: 0.63. 3. LCX: 0.92. 4. RCA: Proximal: 0.98, distal: 0.88.  IMPRESSION: 1. CT FFR analysis showed severe stenosis in the proximal LAD. A cardiac catheterization is recommended.   Electronically Signed   By: Tobias Alexander   On: 07/12/2019 08:48  ADDENDUM REPORT: 07/11/2019 16:54  CLINICAL DATA:  55 year old male with hyperlipidemia, hypertension and chest  pain.  EXAM: Cardiac/Coronary  CTA  TECHNIQUE: The patient was scanned on a [employer].  FINDINGS: A 100 kV prospective scan was triggered in the descending thoracic aorta at 111 HU's. Axial non-contrast 3 mm slices were carried out through the heart. The data set was analyzed on a dedicated work station and scored using the Agatson method. Gantry rotation speed was 250 msecs and collimation was .6 mm. 100 mg of PO Metoprolol and 0.8 mg of sl NTG was given. The 3D data set was reconstructed in 5% intervals of the 67-82 % of the R-R cycle. Diastolic phases were analyzed on a dedicated work station using MPR, MIP and VRT modes. The patient received 80 cc of contrast.  Aorta:  Normal size.  Mild  diffuse calcifications.  No dissection.  Aortic Valve:  Trileaflet.  No calcifications.  Coronary Arteries:  Normal coronary origin.  Right dominance.  RCA is a large dominant artery that gives rise to PDA and PLA. There is only minimal plaque.  Left main is a large artery that gives rise to LAD and LCX arteries. Left main has no plaque.  LAD is a large vessel that gives rise to one small diagonal artery. There is mild mixed plaque in the proximal LAD with stenosis 25-49%. There is another non-calcified plaque in the proximal LAD with stenosis 50-69% but possibly > 70%. Mid and distal LAD has minimal calcified plaque.  LCX is a non-dominant artery that gives rise to two OM branches. There is minimal plaque.  Other findings:  Normal pulmonary vein drainage into the left atrium.  Normal left atrial appendage without a thrombus.  Normal size of the pulmonary artery.  IMPRESSION: 1. Coronary calcium score of 5. This was 90 percentile for age and sex matched control.  2. Normal coronary origin with right dominance.  3. CAD-RADS 3. At least moderate stenosis in the proximal LAD. Consider symptom-guided anti-ischemic pharmacotherapy as well as risk  factor modification per guideline directed care. Additional analysis with CT FFR will be submitted.   Electronically Signed   By: Tobias Alexander   On: 07/11/2019 16:54  EXAM: OVER-READ INTERPRETATION  CT CHEST  The following report is an over-read performed by radiologist Dr. Trudie Reed of [facility] on 07/11/2019. This over-read does not include interpretation of cardiac or coronary anatomy or pathology. The coronary calcium score/coronary CTA interpretation by the cardiologist is attached.  COMPARISON:  None.  FINDINGS: Within the visualized portions of the thorax there are no suspicious appearing pulmonary nodules or masses, there is no acute consolidative airspace disease, no pleural effusions, no pneumothorax and no lymphadenopathy. Visualized portions of the upper abdomen are unremarkable. There are no aggressive appearing lytic or blastic lesions noted in the visualized portions of the skeleton.  IMPRESSION: 1. No significant incidental noncardiac findings are noted.  Electronically Signed: By: Trudie Reed M.D. On: 07/11/2019 15:16     EKG:  EKG is ordered today, personally reviewed, demonstrating NSR 69bpm, inferior Q waves in lead III, avF, TWI III, poor R wave progression in precordial leads  Recent Labs: 07/18/2019: BUN 13; Creatinine, Ser 0.75; Hemoglobin 15.5; Platelets 268; Potassium 4.6; Sodium 138; TSH 1.380 11/08/2019: ALT 24  Recent Lipid Panel    Component Value Date/Time   CHOL 152 11/08/2019 1322   TRIG 116 11/08/2019 1322   HDL 46 11/08/2019 1322   CHOLHDL 3.3 11/08/2019 1322   LDLCALC 85 11/08/2019 1322    PHYSICAL EXAM:    VS:  BP 130/86   Pulse 62   Ht 5' 8.11" (1.73 m)   Wt 274 lb 12.8 oz (124.6 kg)   SpO2 97%   BMI 41.65 kg/m   BMI: Body mass index is 41.65 kg/m.  GEN: Well nourished, well developed [place] M, in no acute distress HEENT: normocephalic, atraumatic Neck: no JVD, carotid bruits, or  masses Cardiac: RRR; no murmurs, rubs, or gallops, no edema  Respiratory:  clear to auscultation bilaterally, normal work of breathing GI: soft, nontender, nondistended, + BS MS: no deformity or atrophy Skin: warm and dry, no rash Neuro:  Alert and Oriented x 3, Strength and sensation are intact, follows commands Psych: euthymic mood, full affect  Wt Readings from Last 3 Encounters:  04/14/20 274 lb 12.8 oz (  124.6 kg)  10/10/19 271 lb (122.9 kg)  08/09/19 274 lb 1.9 oz (124.3 kg)    Cardiac cath: 07/24/19    Ost LAD to Prox LAD lesion is 40% stenosed.  Non-stenotic Prox LAD to Mid LAD lesion.  Mid LAD lesion is 40% stenosed.  1st Diag lesion is 50% stenosed.  Dist LAD lesion is 20% stenosed.   Mild to moderate nonobstructive CAD with smooth diffuse narrowing of approximately 40% in the proximal LAD before the first diagonal vessel.  There is 50% diffuse mid diagonal stenosis.  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  There is mild 20% smooth distal LAD narrowing.   ASSESSMENT & PLAN:   1. Moderate nonobstructive CAD on coronary CTA, 50% stenosis by cardiac cath, patient has multiple risk factors including diabetes, morbid obesity, physical inactivity, hypertension hyperlipidemia as well as family history coronary artery disease.  He was started on aggressive medical management, he is tolerating high-dose statin well, aspirin, his blood pressure is now controlled, he is advised to continue healthy diet and exercise for 30 minutes up to 5 times a week.  His most recent lipids a month ago showed HDL 44, triglycerides 222 and LDL of 155, this was repeated today as it not clear if those were fasting.  If they remain elevated, he needs to be referred to the lipid clinic for further management as he might require PCSK9 inhibitors. 2. Essential HTN -improved. 3. Hyperlipidemia -he is on high-dose  atorvastatin 80 mg daily we will continue. 4. Dilation of aorta - noted by echocardiogram but CT showed normal aortic size. Consider repeating echo in 1 year to trend.  Disposition: F/u in 6 months with Dr. Shari Prows.  Medication Adjustments/Labs and Tests Ordered: Current medicines are reviewed at length with the patient today.  Concerns regarding medicines are outlined above. Medication changes, Labs and Tests ordered today are summarized above and listed in the Patient Instructions accessible in Encounters.   Signed, Tobias Alexander, MD  04/14/2020 3:00 PM    [facility] [address] [place], [place], [place]  [address] Phone: [phone]; Fax: [phone]

## 2020-05-04 ENCOUNTER — Other Ambulatory Visit: Payer: Self-pay | Admitting: *Deleted

## 2020-05-04 DIAGNOSIS — E785 Hyperlipidemia, unspecified: Secondary | ICD-10-CM

## 2020-05-04 DIAGNOSIS — E782 Mixed hyperlipidemia: Secondary | ICD-10-CM

## 2020-05-04 DIAGNOSIS — I2510 Atherosclerotic heart disease of native coronary artery without angina pectoris: Secondary | ICD-10-CM

## 2020-05-04 MED ORDER — EZETIMIBE 10 MG PO TABS
10.0000 mg | ORAL_TABLET | Freq: Every day | ORAL | 3 refills | Status: DC
Start: 2020-05-04 — End: 2020-11-13

## 2020-05-07 ENCOUNTER — Other Ambulatory Visit: Payer: Managed Care, Other (non HMO)

## 2020-06-30 ENCOUNTER — Other Ambulatory Visit: Payer: Self-pay | Admitting: Physician Assistant

## 2020-07-21 ENCOUNTER — Other Ambulatory Visit: Payer: Self-pay

## 2020-07-21 ENCOUNTER — Other Ambulatory Visit: Payer: Managed Care, Other (non HMO)

## 2020-07-21 DIAGNOSIS — E785 Hyperlipidemia, unspecified: Secondary | ICD-10-CM

## 2020-07-21 DIAGNOSIS — E782 Mixed hyperlipidemia: Secondary | ICD-10-CM

## 2020-07-21 DIAGNOSIS — I2510 Atherosclerotic heart disease of native coronary artery without angina pectoris: Secondary | ICD-10-CM

## 2020-07-21 LAB — LIPID PANEL
Chol/HDL Ratio: 2.6 ratio (ref 0.0–5.0)
Cholesterol, Total: 82 mg/dL — ABNORMAL LOW (ref 100–199)
HDL: 31 mg/dL — ABNORMAL LOW (ref >39)
LDL Chol Calc (NIH): 23 mg/dL (ref 0–99)
Triglycerides: 166 mg/dL — ABNORMAL HIGH (ref 0–149)
VLDL Cholesterol Cal: 28 mg/dL (ref 5–40)

## 2020-07-21 LAB — HEPATIC FUNCTION PANEL
ALT: 18 IU/L (ref 0–44)
AST: 17 IU/L (ref 0–40)
Albumin: 4.4 g/dL (ref 3.8–4.9)
Alkaline Phosphatase: 40 IU/L — ABNORMAL LOW (ref 44–121)
Bilirubin Total: 0.5 mg/dL (ref 0.0–1.2)
Bilirubin, Direct: 0.16 mg/dL (ref 0.00–0.40)
Total Protein: 6 g/dL (ref 6.0–8.5)

## 2020-07-22 ENCOUNTER — Telehealth: Payer: Self-pay | Admitting: *Deleted

## 2020-07-27 NOTE — Telephone Encounter (Signed)
error 

## 2020-11-12 NOTE — Progress Notes (Signed)
Cardiology Office Note:    Date:  11/13/2020   ID:  Kevin Munoz, DOB 04/18/1965, MRN [id number]  PCP:  Farris Has, MD   [facility] Providers Cardiologist:  Meriam Sprague, MD Cardiology APP:  Kennon Rounds     Referring MD: Farris Has, MD   Chief Complaint:  F/u for CAD    Patient Profile:   Kevin Munoz is a 55 y.o. male with:  Coronary artery disease  Cath 07/2019: oLAD 40, mLAD 40, dLAD 20; D1 50 >> Med Rx Echocardiogram 5/21: EF 65-70, mod LVH Diabetes mellitus  Hypertension  Hyperlipidemia  Obesity  Dilation of Aorta Echocardiogram 5/21: 38 mm; CT 5/21: Ao normal size    Prior CV studies: Cardiac catheterization 29-Jul-2019 LAD ost 40, mid 40, dist 20; D1 50 LVEDP 30   Coronary CTA 07/11/19 Ca score 5 Hemodynamically significant LAD stenosis based upon FFR analysis  Aorta normal size   Echocardiogram 07/11/19 EF 65-70, no RWMA, mod LVH, normal RVSF, trivial MR, mild dilation at sinuses of Valsalva (43) and ascending Ao (38)    History of Present Illness: Mr. Kevin Munoz was last seen by Dr. Delton See in 3/22.  He returns for f/u.  He is seen with the assistance of an interpreter.  He has been doing well without chest pain, shortness of breath, syncope, orthopnea, edema.  He is getting ready to move back to [place] and would like his records to take with him to his doctor in [place].          Past Medical History:  Diagnosis Date   Abnormal cardiac CT angiography    CAD in native artery    a. by cor CT 06/2019.   Dilatation of aorta (HCC)    a. by echo 06/2019.   DM (diabetes mellitus) (HCC)    DOE (dyspnea on exertion)    Hyperlipidemia    Hypertension    Left ventricular hypertrophy    Tobacco abuse    Current Medications: Current Meds  Medication Sig   amLODipine (NORVASC) 10 MG tablet Take 10 mg by mouth daily.   atorvastatin (LIPITOR) 80 MG tablet TAKE 1 TABLET BY MOUTH ONCE A DAY   carvedilol (COREG) 6.25 MG tablet TAKE ONE TABLET BY  MOUTH TWO TIMES DAILY   glipiZIDE (GLUCOTROL XL) 10 MG 24 hr tablet Take 10 mg by mouth daily.   JARDIANCE 10 MG TABS tablet Take 10 mg by mouth daily.   losartan (COZAAR) 100 MG tablet Take 100 mg by mouth daily.   metFORMIN (GLUCOPHAGE) 1000 MG tablet Take 1,000 mg by mouth 2 (two) times daily with a meal.   nitroGLYCERIN (NITROSTAT) 0.4 MG SL tablet Place 1 tablet (0.4 mg total) under the tongue every 5 (five) minutes as needed.   Current Facility-Administered Medications for the 11/13/20 encounter (Office Visit) with Tereso Newcomer T, PA-C  Medication   sodium chloride flush (NS) 0.9 % injection 3 mL    Allergies:   Patient has no known allergies.   Social History   Tobacco Use   Smoking status: Every Day   Smokeless tobacco: Never  Vaping Use   Vaping Use: Never used  Substance Use Topics   Alcohol use: Never   Drug use: Never    Family Hx: The patient's family history includes CAD in his father; Heart attack in his father.  Review of Systems  Gastrointestinal:  Negative for hematochezia.  Genitourinary:  Negative for hematuria.    EKGs/Labs/Other Test Reviewed:  EKG:  EKG is not ordered today.  The ekg ordered today demonstrates n/a  Recent Labs: 07/21/2020: ALT 18   Recent Lipid Panel Lab Results  Component Value Date/Time   CHOL 82 (L) 07/21/2020 08:33 AM   TRIG 166 (H) 07/21/2020 08:33 AM   HDL 31 (L) 07/21/2020 08:33 AM   LDLCALC 23 07/21/2020 08:33 AM     Risk Assessment/Calculations:          Physical Exam:    VS:  BP 118/72   Pulse 67   Ht 5\' 8"  (1.727 m)   Wt 269 lb 6.4 oz (122.2 kg)   SpO2 95%   BMI 40.96 kg/m     Wt Readings from Last 3 Encounters:  11/13/20 269 lb 6.4 oz (122.2 kg)  04/14/20 274 lb 12.8 oz (124.6 kg)  10/10/19 271 lb (122.9 kg)    Constitutional:      Appearance: Healthy appearance. Not in distress.  Neck:     Vascular: No carotid bruit. JVD normal.  Pulmonary:     Effort: Pulmonary effort is normal.     Breath  sounds: No wheezing. No rales.  Cardiovascular:     Normal rate. Regular rhythm. Normal S1. Normal S2.      Murmurs: There is no murmur.  Edema:    Peripheral edema present.    Pretibial: bilateral trace edema of the pretibial area. Abdominal:     Palpations: Abdomen is soft.  Skin:    General: Skin is warm and dry.  Neurological:     General: No focal deficit present.     Mental Status: Alert and oriented to person, place and time.     Cranial Nerves: Cranial nerves are intact.        ASSESSMENT & PLAN:   1. Coronary artery disease involving native coronary artery of native heart without angina pectoris Mild to moderate nonobstructive coronary disease by cardiac catheterization.  He is doing well without anginal symptoms.  He did not realize he was to remain on aspirin.  I will resume his aspirin 81 mg daily.  Continue atorvastatin 80 mg daily, carvedilol 6.25 mg twice daily.  Resume ezetimibe 10 mg daily.  He will follow-up with his physician in 10/12/19.  I will provide him with his records today.  I have asked him to contact [place] if he decides to stay here so that we can arrange follow-up in 6 months.  2. Essential hypertension Blood pressure is well controlled.  Continue amlodipine 10 mg daily, carvedilol 6.25 mg twice daily, losartan 100 mg daily.  Creatinine in June was normal at 0.81.  3. Mixed hyperlipidemia Lipids were optimal on most recent labs in June.  He was on atorvastatin and ezetimibe at that time.  Somehow his ezetimibe did not get refilled.  Continue atorvastatin 80 mg daily.  Resume ezetimibe 10 mg daily.  4. Dilatation of aorta (HCC) There was dilation of his aorta on echocardiogram last year at 38 mm.  However, his aorta was normal size on CT.  Repeat echocardiogram or CT can be considered at some point in the next year or 2.  This can be arranged with his physician in July.  5. Tobacco abuse I have recommended cessation.    Dispo:  Return if symptoms worsen or  fail to improve.   Medication Adjustments/Labs and Tests Ordered: Current medicines are reviewed at length with the patient today.  Concerns regarding medicines are outlined above.  Tests Ordered: No orders of the defined types  were placed in this encounter.  Medication Changes: Meds ordered this encounter  Medications   ezetimibe (ZETIA) 10 MG tablet    Sig: Take 1 tablet (10 mg total) by mouth daily.    Dispense:  90 tablet    Refill:  3   aspirin EC 81 MG tablet    Sig: Take 1 tablet (81 mg total) by mouth daily.    Dispense:  90 tablet    Refill:  3   Signed, Tereso Newcomer, PA-C  11/13/2020 11:15 AM    [facility] [address] [place], [place], [place]  [address] Phone: [phone]; Fax: [phone]

## 2020-11-13 ENCOUNTER — Other Ambulatory Visit: Payer: Self-pay

## 2020-11-13 ENCOUNTER — Ambulatory Visit: Payer: Managed Care, Other (non HMO) | Admitting: Physician Assistant

## 2020-11-13 ENCOUNTER — Encounter: Payer: Self-pay | Admitting: Physician Assistant

## 2020-11-13 VITALS — BP 118/72 | HR 67 | Ht 68.0 in | Wt 269.4 lb

## 2020-11-13 DIAGNOSIS — E119 Type 2 diabetes mellitus without complications: Secondary | ICD-10-CM

## 2020-11-13 DIAGNOSIS — E782 Mixed hyperlipidemia: Secondary | ICD-10-CM | POA: Diagnosis not present

## 2020-11-13 DIAGNOSIS — I10 Essential (primary) hypertension: Secondary | ICD-10-CM | POA: Diagnosis not present

## 2020-11-13 DIAGNOSIS — I77819 Aortic ectasia, unspecified site: Secondary | ICD-10-CM | POA: Diagnosis not present

## 2020-11-13 DIAGNOSIS — I2510 Atherosclerotic heart disease of native coronary artery without angina pectoris: Secondary | ICD-10-CM

## 2020-11-13 DIAGNOSIS — Z720 Tobacco use: Secondary | ICD-10-CM

## 2020-11-13 MED ORDER — EZETIMIBE 10 MG PO TABS: 10.0000 mg | ORAL_TABLET | Freq: Every day | ORAL | 3 refills | Status: AC

## 2020-11-13 MED ORDER — ASPIRIN EC 81 MG PO TBEC: 81.0000 mg | DELAYED_RELEASE_TABLET | Freq: Every day | ORAL | 3 refills | Status: AC

## 2020-11-13 NOTE — Patient Instructions (Signed)
Medication Instructions:  RESTART Asprin one tablet by mouth ( 81 mg) daily RESTART Zetia one tablet by mouth ( 10 mg) daily.   *If you need a refill on your cardiac medications before your next appointment, please call your pharmacy*  Lab Work:  -NONE  If you have labs (blood work) drawn today and your tests are completely normal, you will receive your results only by: MyChart Message (if you have MyChart) OR A paper copy in the mail If you have any lab test that is abnormal or we need to change your treatment, we will call you to review the results.  Testing/Procedures:  -NONE-  Follow-Up: At [facility], you and your health needs are our priority.  As part of our continuing mission to provide you with exceptional heart care, we have created designated Provider Care Teams.  These Care Teams include your primary Cardiologist (physician) and Advanced Practice Providers (APPs -  Physician Assistants and Nurse Practitioners) who all work together to provide you with the care you need, when you need it.  We recommend signing up for the patient portal called "MyChart".  Sign up information is provided on this After Visit Summary.  MyChart is used to connect with patients for Virtual Visits (Telemedicine).  Patients are able to view lab/test results, encounter notes, upcoming appointments, etc.  Non-urgent messages can be sent to your provider as well.   To learn more about what you can do with MyChart, go to [email or web address].    Your next appointment:    AS NEEDED  Pt is moving back to [place].  The format for your next appointment:    Provider:   You may see Meriam Sprague, MD or one of the following Advanced Practice Providers on your designated Care Team:   Tereso Newcomer, PA-C  Other Instructions  As needed patient is moving back to [place].

## 2021-12-22 ENCOUNTER — Encounter: Payer: Self-pay | Admitting: Physician Assistant

## 2022-04-28 IMAGING — CT CT HEART MORP W/ CTA COR W/ SCORE W/ CA W/CM &/OR W/O CM
4 of 7 series · 8 of 20 positions shown, 9 images · non-contrast
Comparison: None.
COMPARISON: None.

Addendum:
EXAM:
OVER-READ INTERPRETATION  CT CHEST

The following report is an over-read performed by radiologist Dr.
Gargi Tiger [REDACTED] on 07/11/2019. This
over-read does not include interpretation of cardiac or coronary
anatomy or pathology. The coronary calcium score/coronary CTA
interpretation by the cardiologist is attached.
CLINICAL DATA: 53-year-old male with hyperlipidemia, hypertension
and chest pain.
Cardiac/Coronary  CTA
TECHNIQUE: The patient was scanned on a [employer] scanner.

[Series 6: best diast 74 % · axial · 0.39mm/px · z∈[-134,-89]mm · 2 of 338 slices shown]
[im 113/338  vessel]
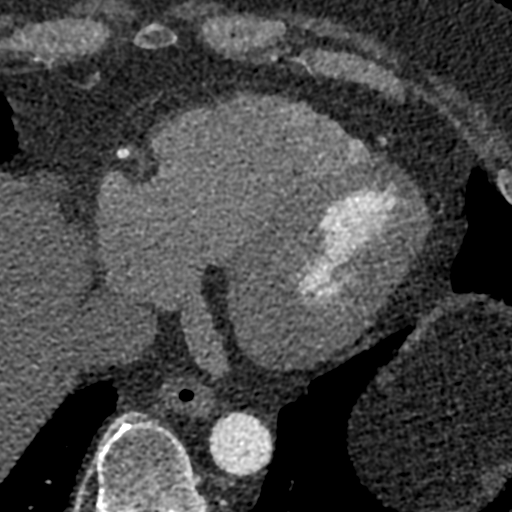
[im 225/338  vessel]
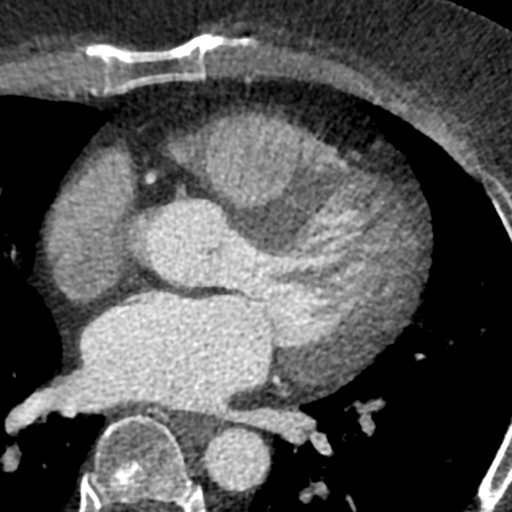

[Series 7: best syst · axial · 0.39mm/px · z∈[-134,-89]mm · 2 of 338 slices shown, 3 images]
[im 113/338  vessel]
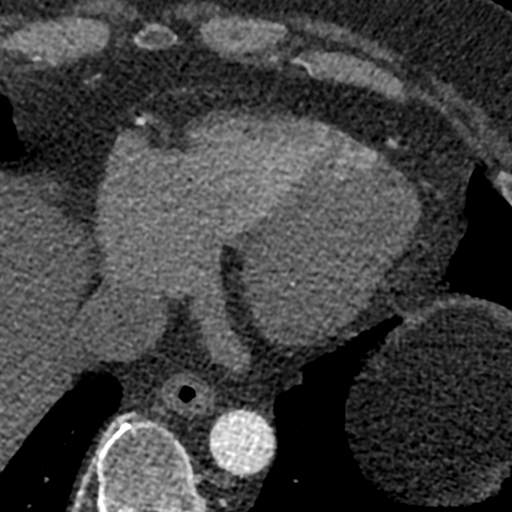
[im 113/338  lung]
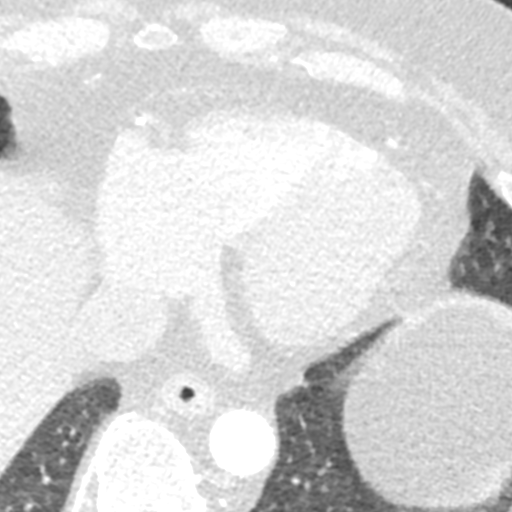
[im 225/338  vessel]
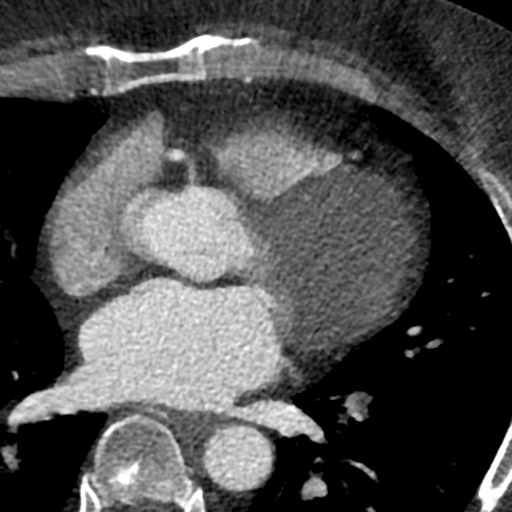

[Series 9: ts diast sharp · axial · 0.39mm/px · z∈[-134,-89]mm · 2 of 338 slices shown]
[im 113/338  lung]
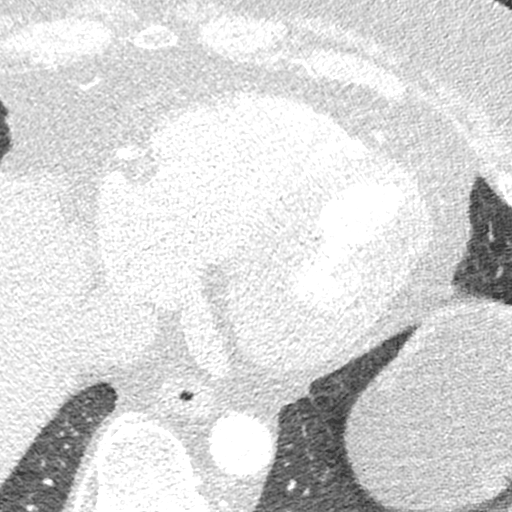
[im 225/338  lung]
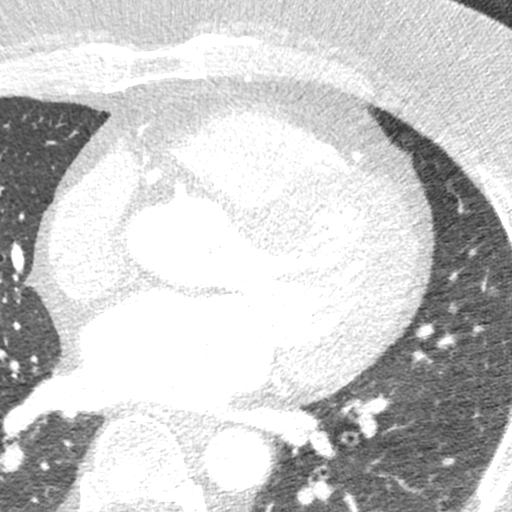

[Series 10: ts syst sharp 36 % · axial · 0.39mm/px · z∈[-134,-89]mm · 2 of 338 slices shown]
[im 113/338  lung]
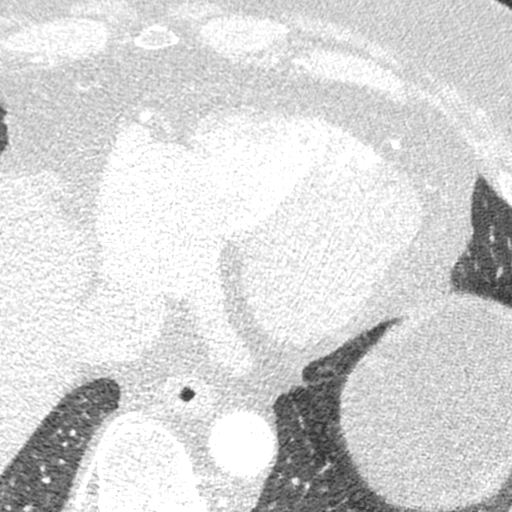
[im 225/338  lung]
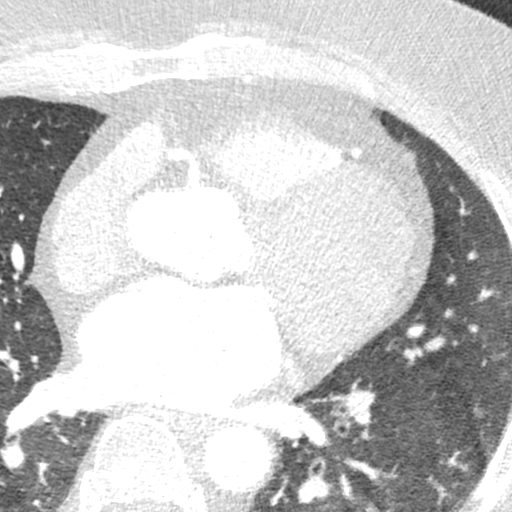

[8 of 20 positions shown; findings below may reference images not displayed]

FINDINGS: Within the visualized portions of the thorax there are no suspicious
appearing pulmonary nodules or masses, there is no acute
consolidative airspace disease, no pleural effusions, no
pneumothorax and no lymphadenopathy. Visualized portions of the
upper abdomen are unremarkable. There are no aggressive appearing
lytic or blastic lesions noted in the visualized portions of the
skeleton.
IMPRESSION: 1. No significant incidental noncardiac findings are noted.
FINDINGS: A 100 kV prospective scan was triggered in the descending thoracic
aorta at 111 HU's. Axial non-contrast 3 mm slices were carried out
through the heart. The data set was analyzed on a dedicated work
station and scored using the Agatson method. Gantry rotation speed
was 250 msecs and collimation was .6 mm. 100 mg of PO Metoprolol and
0.8 mg of sl NTG was given. The 3D data set was reconstructed in 5%
intervals of the 67-82 % of the R-R cycle. Diastolic phases were
analyzed on a dedicated work station using MPR, MIP and VRT modes.
The patient received 80 cc of contrast.

Aorta:  Normal size.  Mild diffuse calcifications.  No dissection.

Aortic Valve:  Trileaflet.  No calcifications.

Coronary Arteries:  Normal coronary origin.  Right dominance.

RCA is a large dominant artery that gives rise to PDA and PLA. There
is only minimal plaque.

Left main is a large artery that gives rise to LAD and LCX arteries.
Left main has no plaque.

LAD is a large vessel that gives rise to one small diagonal artery.
There is mild mixed plaque in the proximal LAD with stenosis 25-49%.
There is another non-calcified plaque in the proximal LAD with
stenosis 50-69% but possibly > 70%. Mid and distal LAD has minimal
calcified plaque.

LCX is a non-dominant artery that gives rise to two OM branches.
There is minimal plaque.

Other findings:

Normal pulmonary vein drainage into the left atrium.

Normal left atrial appendage without a thrombus.

Normal size of the pulmonary artery.
IMPRESSION: 1. Coronary calcium score of 5. This was 63 percentile for age and
sex matched control.

2. Normal coronary origin with right dominance.

3. CAD-RADS 3. At least moderate stenosis in the proximal LAD.
Consider symptom-guided anti-ischemic pharmacotherapy as well as
risk factor modification per guideline directed care. Additional
analysis with CT FFR will be submitted.

*** End of Addendum ***
EXAM:
OVER-READ INTERPRETATION  CT CHEST

The following report is an over-read performed by radiologist Dr.
Gargi Tiger [REDACTED] on 07/11/2019. This
over-read does not include interpretation of cardiac or coronary
anatomy or pathology. The coronary calcium score/coronary CTA
interpretation by the cardiologist is attached.
FINDINGS: Within the visualized portions of the thorax there are no suspicious
appearing pulmonary nodules or masses, there is no acute
consolidative airspace disease, no pleural effusions, no
pneumothorax and no lymphadenopathy. Visualized portions of the
upper abdomen are unremarkable. There are no aggressive appearing
lytic or blastic lesions noted in the visualized portions of the
skeleton.
IMPRESSION: 1. No significant incidental noncardiac findings are noted.
